# Patient Record
Sex: Female | Born: 1960
Health system: Southern US, Community
[De-identification: ages and names within clinical notes are randomized; demographics above are authoritative.]

## PROBLEM LIST (undated history)

## (undated) DIAGNOSIS — E559 Vitamin D deficiency, unspecified: Secondary | ICD-10-CM

## (undated) DIAGNOSIS — K219 Gastro-esophageal reflux disease without esophagitis: Secondary | ICD-10-CM

## (undated) DIAGNOSIS — N83209 Unspecified ovarian cyst, unspecified side: Secondary | ICD-10-CM

## (undated) DIAGNOSIS — F419 Anxiety disorder, unspecified: Secondary | ICD-10-CM

## (undated) DIAGNOSIS — E785 Hyperlipidemia, unspecified: Secondary | ICD-10-CM

## (undated) DIAGNOSIS — D649 Anemia, unspecified: Secondary | ICD-10-CM

## (undated) DIAGNOSIS — F329 Major depressive disorder, single episode, unspecified: Secondary | ICD-10-CM

## (undated) DIAGNOSIS — F32A Depression, unspecified: Secondary | ICD-10-CM

## (undated) HISTORY — DX: Vitamin D deficiency, unspecified: E55.9

## (undated) HISTORY — PX: LIPOSUCTION: SHX10

## (undated) HISTORY — DX: Hyperlipidemia, unspecified: E78.5

## (undated) HISTORY — DX: Depression, unspecified: F32.A

## (undated) HISTORY — DX: Anxiety disorder, unspecified: F41.9

## (undated) HISTORY — PX: COLONOSCOPY: SHX174

## (undated) HISTORY — DX: Unspecified ovarian cyst, unspecified side: N83.209

## (undated) HISTORY — PX: ESOPHAGOGASTRODUODENOSCOPY: SHX1529

## (undated) HISTORY — DX: Anemia, unspecified: D64.9

## (undated) HISTORY — PX: TOE SURGERY: SHX1073

## (undated) HISTORY — PX: ABDOMINAL HYSTERECTOMY: SHX81

---
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
## 2005-03-03 ENCOUNTER — Other Ambulatory Visit: Admission: RE | Admit: 2005-03-03 | Discharge: 2005-03-03 | Payer: Self-pay | Admitting: Obstetrics & Gynecology

## 2007-12-27 ENCOUNTER — Encounter: Admission: RE | Admit: 2007-12-27 | Discharge: 2007-12-27 | Payer: Self-pay | Admitting: Interventional Radiology

## 2008-01-06 ENCOUNTER — Encounter: Admission: RE | Admit: 2008-01-06 | Discharge: 2008-01-06 | Payer: Self-pay | Admitting: Interventional Radiology

## 2008-02-01 ENCOUNTER — Encounter: Admission: RE | Admit: 2008-02-01 | Discharge: 2008-02-01 | Payer: Self-pay | Admitting: Interventional Radiology

## 2008-02-20 ENCOUNTER — Ambulatory Visit (HOSPITAL_COMMUNITY): Admission: RE | Admit: 2008-02-20 | Discharge: 2008-02-20 | Payer: Self-pay | Admitting: Gynecology

## 2008-02-22 ENCOUNTER — Ambulatory Visit: Payer: Self-pay | Admitting: Oncology

## 2008-03-06 ENCOUNTER — Ambulatory Visit: Payer: Self-pay | Admitting: Gastroenterology

## 2008-04-06 ENCOUNTER — Ambulatory Visit: Payer: Self-pay | Admitting: Gastroenterology

## 2008-04-06 ENCOUNTER — Ambulatory Visit: Payer: Self-pay | Admitting: Oncology

## 2012-11-07 ENCOUNTER — Ambulatory Visit (INDEPENDENT_AMBULATORY_CARE_PROVIDER_SITE_OTHER): Payer: 59 | Admitting: Obstetrics and Gynecology

## 2012-11-07 ENCOUNTER — Encounter: Payer: Self-pay | Admitting: Obstetrics and Gynecology

## 2012-11-07 VITALS — BP 106/64 | HR 66 | Ht 66.5 in | Wt 172.0 lb

## 2012-11-07 DIAGNOSIS — Z0000 Encounter for general adult medical examination without abnormal findings: Secondary | ICD-10-CM

## 2012-11-07 DIAGNOSIS — Z124 Encounter for screening for malignant neoplasm of cervix: Secondary | ICD-10-CM

## 2012-11-07 LAB — CBC
HCT: 35.7 % — ABNORMAL LOW (ref 36.0–46.0)
MCV: 84.4 fL (ref 78.0–100.0)
Platelets: 274 10*3/uL (ref 150–400)
RBC: 4.23 MIL/uL (ref 3.87–5.11)
RDW: 13.2 % (ref 11.5–15.5)
WBC: 4.4 10*3/uL (ref 4.0–10.5)

## 2012-11-07 NOTE — Progress Notes (Signed)
Last Pap: 09/09/07 WNL: Yes Regular Periods:no Contraception: none, pt not currently sexually active  Monthly Breast exam:yes Tetanus<17yrs:no pt unsure Nl.Bladder Function:yes Daily BMs:yes Healthy Diet:yes Calcium:no Mammogram:yes Date of Mammogram: 2-3 years ago per pt Exercise:yes Have often Exercise: 3 times per week  Seatbelt: yes Abuse at home: no Stressful work:no Sigmoid-colonoscopy: 2010 per pt Bone Density: No PCP: none Change in PMH: none Change in [facility] none Pt requests fasting labs today. BP 106/64  Pulse 66  Ht 5' 6.5" (1.689 m)  Wt 172 lb (78.019 kg)  BMI 27.35 kg/m2  LMP 07/14/2012 Pt with complaints:no Physical Examination: General appearance - alert, well appearing, and in no distress Mental status - normal mood, behavior, speech, dress, motor activity, and thought processes Neck - supple, no significant adenopathy,  thyroid exam: thyroid is normal in size without nodules or tenderness Chest - clear to auscultation, no wheezes, rales or rhonchi, symmetric air entry Heart - normal rate and regular rhythm Abdomen - soft, nontender, nondistended, no masses or organomegaly Breasts - breasts appear normal, no suspicious masses, no skin or nipple changes or axillary nodes Pelvic - normal external genitalia, vulva, vagina, cervix, uterus and adnexa Rectal - normal rectal, no masses Back exam - full range of motion, no tenderness, palpable spasm or pain on motion Neurological - alert, oriented, normal speech, no focal findings or movement disorder noted Musculoskeletal - no joint tenderness, deformity or swelling Extremities - no edema, redness or tenderness in the calves or thighs Skin - normal coloration and turgor, no rashes, no suspicious skin lesions noted Routine exam Pap sent yes Mammogram due yes will schedule abstinance used for contraception RT 1 yr

## 2012-11-08 ENCOUNTER — Telehealth: Payer: Self-pay

## 2012-11-08 LAB — COMPREHENSIVE METABOLIC PANEL
ALT: 11 U/L (ref 0–35)
BUN: 11 mg/dL (ref 6–23)
CO2: 30 mEq/L (ref 19–32)
Calcium: 9.7 mg/dL (ref 8.4–10.5)
Chloride: 101 mEq/L (ref 96–112)
Creat: 0.94 mg/dL (ref 0.50–1.10)
Glucose, Bld: 85 mg/dL (ref 70–99)
Total Bilirubin: 0.7 mg/dL (ref 0.3–1.2)

## 2012-11-08 LAB — TSH: TSH: 1.653 u[IU]/mL (ref 0.350–4.500)

## 2012-11-08 LAB — LIPID PANEL
HDL: 54 mg/dL (ref >39)
Total CHOL/HDL Ratio: 3.4 Ratio
Triglycerides: 105 mg/dL (ref <150)

## 2012-11-08 NOTE — Telephone Encounter (Signed)
Spoke with pt rgd labs informed elevated ldl need f/u with pcp pt voice understanding

## 2012-11-08 NOTE — Telephone Encounter (Signed)
Message copied by Rolla Plate on Tue Nov 08, 2012 11:32 AM ------      Message from: Jaymes Graff      Created: Tue Nov 08, 2012 11:31 AM       Please review the labs with the patient.  She needs to follow up with a PCP because her LDL is elevated.  You can refer her to Dr Daphine Deutscher or Dr Nehemiah Settle

## 2012-11-09 LAB — PAP IG, CT-NG, RFX HPV ASCU: GC Probe Amp: NEGATIVE

## 2013-03-24 ENCOUNTER — Encounter: Payer: Self-pay | Admitting: Gastroenterology

## 2013-08-18 ENCOUNTER — Ambulatory Visit: Payer: Self-pay | Admitting: Internal Medicine

## 2013-08-18 VITALS — BP 114/64 | HR 66 | Temp 98.7°F | Resp 18 | Ht 67.0 in | Wt 152.0 lb

## 2013-08-18 DIAGNOSIS — R350 Frequency of micturition: Secondary | ICD-10-CM

## 2013-08-18 DIAGNOSIS — R319 Hematuria, unspecified: Secondary | ICD-10-CM

## 2013-08-18 DIAGNOSIS — N390 Urinary tract infection, site not specified: Secondary | ICD-10-CM

## 2013-08-18 LAB — POCT UA - MICROSCOPIC ONLY
Casts, Ur, LPF, POC: NEGATIVE
Mucus, UA: NEGATIVE

## 2013-08-18 LAB — POCT URINALYSIS DIPSTICK
Bilirubin, UA: NEGATIVE
Glucose, UA: NEGATIVE
Ketones, UA: NEGATIVE
Nitrite, UA: NEGATIVE
pH, UA: 7

## 2013-08-18 MED ORDER — CIPROFLOXACIN HCL 500 MG PO TABS: 500.0000 mg | ORAL_TABLET | Freq: Two times a day (BID) | ORAL | Status: DC

## 2013-08-18 NOTE — Progress Notes (Signed)
  Subjective:    Patient ID: Megan Miles, female    DOB: [birth year], 52 y.o.   MRN: [id number]  HPI    Review of Systems     Objective:   Physical Exam   Results for orders placed in visit on 08/18/13  POCT URINALYSIS DIPSTICK      Result Value Range   Color, UA yellow     Clarity, UA clear     Glucose, UA neg     Bilirubin, UA neg     Ketones, UA neg     Spec Grav, UA 1.020     Blood, UA trace-intact     pH, UA 7.0     Protein, UA neg     Urobilinogen, UA 0.2     Nitrite, UA neg     Leukocytes, UA moderate (2+)    POCT UA - MICROSCOPIC ONLY      Result Value Range   WBC, Ur, HPF, POC 6-8     RBC, urine, microscopic 0-3     Bacteria, U Microscopic 1+     Mucus, UA neg     Epithelial cells, urine per micros 3-5     Crystals, Ur, HPF, POC neg     Casts, Ur, LPF, POC neg     Yeast, UA neg     Amorphous, UA pos          Assessment & Plan:  UTI/Cipro 10d

## 2013-08-18 NOTE — Patient Instructions (Signed)
Urinary Tract Infection  Urinary tract infections (UTIs) can develop anywhere along your urinary tract. Your urinary tract is your body's drainage system for removing wastes and extra water. Your urinary tract includes two kidneys, two ureters, a bladder, and a urethra. Your kidneys are a pair of bean-shaped organs. Each kidney is about the size of your fist. They are located below your ribs, one on each side of your spine.  CAUSES  Infections are caused by microbes, which are microscopic organisms, including fungi, viruses, and bacteria. These organisms are so small that they can only be seen through a microscope. Bacteria are the microbes that most commonly cause UTIs.  SYMPTOMS   Symptoms of UTIs may vary by age and gender of the patient and by the location of the infection. Symptoms in young women typically include a frequent and intense urge to urinate and a painful, burning feeling in the bladder or urethra during urination. Older women and men are more likely to be tired, shaky, and weak and have muscle aches and abdominal pain. A fever may mean the infection is in your kidneys. Other symptoms of a kidney infection include pain in your back or sides below the ribs, nausea, and vomiting.  DIAGNOSIS  To diagnose a UTI, your caregiver will ask you about your symptoms. Your caregiver also will ask to provide a urine sample. The urine sample will be tested for bacteria and white blood cells. White blood cells are made by your body to help fight infection.  TREATMENT   Typically, UTIs can be treated with medication. Because most UTIs are caused by a bacterial infection, they usually can be treated with the use of antibiotics. The choice of antibiotic and length of treatment depend on your symptoms and the type of bacteria causing your infection.  HOME CARE INSTRUCTIONS   If you were prescribed antibiotics, take them exactly as your caregiver instructs you. Finish the medication even if you feel better after you  have only taken some of the medication.   Drink enough water and fluids to keep your urine clear or pale yellow.   Avoid caffeine, tea, and carbonated beverages. They tend to irritate your bladder.   Empty your bladder often. Avoid holding urine for long periods of time.   Empty your bladder before and after sexual intercourse.   After a bowel movement, women should cleanse from front to back. Use each tissue only once.  SEEK MEDICAL CARE IF:    You have back pain.   You develop a fever.   Your symptoms do not begin to resolve within 3 days.  SEEK IMMEDIATE MEDICAL CARE IF:    You have severe back pain or lower abdominal pain.   You develop chills.   You have nausea or vomiting.   You have continued burning or discomfort with urination.  MAKE SURE YOU:    Understand these instructions.   Will watch your condition.   Will get help right away if you are not doing well or get worse.  Document Released: 09/09/2005 Document Revised: 05/31/2012 Document Reviewed: 01/08/2012  ExitCare Patient Information 2014 [employer].

## 2013-08-18 NOTE — Progress Notes (Signed)
  Subjective:    Patient ID: Megan Miles, female    DOB: [birth year], 52 y.o.   MRN: [id number]  HPI 52 year old African American female is here today with complaints of frequent urination and leg tenderness for the past four weeks. Patient does not have a PCP. Patient sees a gyn - Dr. Jennette Kettle at [facility]. Pt states she has had a UTI before. Pt states she has no other complaints. No fever,chills, flank pain.    Review of Systems neg    Objective:   Physical Exam  Vitals reviewed. Constitutional: She is oriented to person, place, and time. She appears well-developed and well-nourished. No distress.  HENT:  Head: Normocephalic.  Pulmonary/Chest: Effort normal.  Abdominal: Soft.  Musculoskeletal: Normal range of motion.  Neurological: She is alert and oriented to person, place, and time. She exhibits normal muscle tone. Coordination normal.  Psychiatric: She has a normal mood and affect.    Tender over bladder Results for orders placed in visit on 08/18/13  POCT URINALYSIS DIPSTICK      Result Value Range   Color, UA yellow     Clarity, UA clear     Glucose, UA neg     Bilirubin, UA neg     Ketones, UA neg     Spec Grav, UA 1.020     Blood, UA trace-intact     pH, UA 7.0     Protein, UA neg     Urobilinogen, UA 0.2     Nitrite, UA neg     Leukocytes, UA moderate (2+)    POCT UA - MICROSCOPIC ONLY      Result Value Range   WBC, Ur, HPF, POC 6-8     RBC, urine, microscopic 0-3     Bacteria, U Microscopic 1+     Mucus, UA neg     Epithelial cells, urine per micros 3-5     Crystals, Ur, HPF, POC neg     Casts, Ur, LPF, POC neg     Yeast, UA neg     Amorphous, UA pos          Assessment & Plan:  UTI/Cipro 10d

## 2013-08-20 LAB — URINE CULTURE: Colony Count: 50000

## 2013-09-07 ENCOUNTER — Telehealth: Payer: Self-pay

## 2013-09-07 NOTE — Telephone Encounter (Signed)
Patient of Dr. Perrin Maltese,  She was at the gym this morning and pulled a muscle.  Currently, she is in [place] to catch a plane in the morning to [place].    Patient is requesting a doctor to call her at [phone].

## 2013-09-07 NOTE — Telephone Encounter (Signed)
Called her, she can try Aleve bid and she should apply heat/ perhaps a thermacare wrap. She will try these.

## 2013-12-28 ENCOUNTER — Emergency Department (HOSPITAL_COMMUNITY)
Admission: EM | Admit: 2013-12-28 | Discharge: 2013-12-28 | Disposition: A | Discharge status: Home / Self Care | Payer: No Typology Code available for payment source | Attending: Emergency Medicine | Admitting: Emergency Medicine

## 2013-12-28 ENCOUNTER — Emergency Department (HOSPITAL_COMMUNITY): Payer: No Typology Code available for payment source

## 2013-12-28 ENCOUNTER — Encounter (HOSPITAL_COMMUNITY): Payer: Self-pay | Admitting: Emergency Medicine

## 2013-12-28 DIAGNOSIS — S0990XA Unspecified injury of head, initial encounter: Secondary | ICD-10-CM | POA: Insufficient documentation

## 2013-12-28 DIAGNOSIS — S298XXA Other specified injuries of thorax, initial encounter: Secondary | ICD-10-CM | POA: Insufficient documentation

## 2013-12-28 DIAGNOSIS — Y92410 Unspecified street and highway as the place of occurrence of the external cause: Secondary | ICD-10-CM | POA: Insufficient documentation

## 2013-12-28 DIAGNOSIS — Z862 Personal history of diseases of the blood and blood-forming organs and certain disorders involving the immune mechanism: Secondary | ICD-10-CM | POA: Insufficient documentation

## 2013-12-28 DIAGNOSIS — S6990XA Unspecified injury of unspecified wrist, hand and finger(s), initial encounter: Secondary | ICD-10-CM | POA: Insufficient documentation

## 2013-12-28 DIAGNOSIS — IMO0002 Reserved for concepts with insufficient information to code with codable children: Secondary | ICD-10-CM | POA: Insufficient documentation

## 2013-12-28 DIAGNOSIS — S59909A Unspecified injury of unspecified elbow, initial encounter: Secondary | ICD-10-CM | POA: Insufficient documentation

## 2013-12-28 DIAGNOSIS — Z8742 Personal history of other diseases of the female genital tract: Secondary | ICD-10-CM | POA: Insufficient documentation

## 2013-12-28 DIAGNOSIS — Z792 Long term (current) use of antibiotics: Secondary | ICD-10-CM | POA: Insufficient documentation

## 2013-12-28 DIAGNOSIS — Y9389 Activity, other specified: Secondary | ICD-10-CM | POA: Insufficient documentation

## 2013-12-28 DIAGNOSIS — S59919A Unspecified injury of unspecified forearm, initial encounter: Secondary | ICD-10-CM

## 2013-12-28 MED ORDER — ONDANSETRON HCL 4 MG/2ML IJ SOLN
4.0000 mg | Freq: Once | INTRAMUSCULAR | Status: AC
  Administered 2013-12-28: 4 mg via INTRAVENOUS
  Filled 2013-12-28: qty 2

## 2013-12-28 MED ORDER — MORPHINE SULFATE 4 MG/ML IJ SOLN
4.0000 mg | Freq: Once | INTRAMUSCULAR | Status: AC
  Administered 2013-12-28: 4 mg via INTRAVENOUS
  Filled 2013-12-28: qty 1

## 2013-12-28 MED ORDER — OXYCODONE-ACETAMINOPHEN 5-325 MG PO TABS: 2.0000 | ORAL_TABLET | Freq: Four times a day (QID) | ORAL | Status: DC | PRN

## 2013-12-28 MED ORDER — METHOCARBAMOL 500 MG PO TABS: 500.0000 mg | ORAL_TABLET | Freq: Two times a day (BID) | ORAL | Status: DC

## 2013-12-28 MED ORDER — KETOROLAC TROMETHAMINE 30 MG/ML IJ SOLN
30.0000 mg | Freq: Once | INTRAMUSCULAR | Status: AC
  Administered 2013-12-28: 30 mg via INTRAVENOUS
  Filled 2013-12-28: qty 1

## 2013-12-28 MED ORDER — IBUPROFEN 800 MG PO TABS: 800.0000 mg | ORAL_TABLET | Freq: Three times a day (TID) | ORAL | Status: DC

## 2013-12-28 MED ORDER — HYDROMORPHONE HCL PF 1 MG/ML IJ SOLN
1.0000 mg | Freq: Once | INTRAMUSCULAR | Status: AC
  Administered 2013-12-28: 1 mg via INTRAVENOUS
  Filled 2013-12-28: qty 1
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
## 2013-12-28 NOTE — ED Notes (Signed)
Patient transported to X-ray 
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
## 2013-12-28 NOTE — ED Notes (Signed)
Pt states currently unable to ambulate, sts that she just received a dose of pain med and she feels a little loopy.  Sts she will wait a few minutes and ring when ready.
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
## 2013-12-29 NOTE — ED Notes (Signed)
Pt ambulating independently w/ steady gait on d/c in no acute distress, A&Ox4. D/c instructions reviewed w/ pt and family - pt and family deny any further questions or concerns at present. Rx given x3  

## 2014-01-01 NOTE — ED Provider Notes (Signed)
Medical screening examination/treatment/procedure(s) were conducted as a shared visit with non-physician practitioner(s) and myself.  I personally evaluated the patient during the encounter.  EKG Interpretation   None        Leota Jacobsen, MD 01/01/14 [phone]

## 2014-01-02 ENCOUNTER — Telehealth (HOSPITAL_COMMUNITY): Payer: Self-pay

## 2014-01-02 NOTE — ED Notes (Signed)
Pt calling for hand/ortho on call MD for when she was here.  Was told she would be referred to hand MD but didn't receive info.  Informed Dr Caralyn Guile on call for duration of her visit.  Pt provided contact info.

## 2014-05-13 ENCOUNTER — Encounter: Payer: Self-pay | Admitting: Gastroenterology

## 2014-10-15 ENCOUNTER — Encounter (HOSPITAL_COMMUNITY): Payer: Self-pay | Admitting: Emergency Medicine

## 2015-02-26 ENCOUNTER — Ambulatory Visit (INDEPENDENT_AMBULATORY_CARE_PROVIDER_SITE_OTHER): Payer: BLUE CROSS/BLUE SHIELD | Admitting: Physician Assistant

## 2015-02-26 VITALS — BP 125/80 | HR 71 | Temp 97.4°F | Resp 20 | Ht 67.0 in | Wt 163.2 lb

## 2015-02-26 DIAGNOSIS — R109 Unspecified abdominal pain: Secondary | ICD-10-CM | POA: Diagnosis not present

## 2015-02-26 DIAGNOSIS — R112 Nausea with vomiting, unspecified: Secondary | ICD-10-CM | POA: Diagnosis not present

## 2015-02-26 MED ORDER — ONDANSETRON 8 MG PO TBDP: 8.0000 mg | ORAL_TABLET | Freq: Three times a day (TID) | ORAL | Status: DC | PRN

## 2015-02-26 MED ORDER — HYOSCYAMINE SULFATE 0.125 MG SL SUBL: 0.1250 mg | SUBLINGUAL_TABLET | SUBLINGUAL | Status: DC | PRN

## 2015-02-26 NOTE — Patient Instructions (Signed)
Please continue to drink water and stay hydrated.  If you are not able to drink fluids and keep them down, you need to return to the clinic. Please return to the clinic if your symptoms do not improve in 24 hrs. You may have some diarrhea within the next 24 hours.  Please allow this to occur without anti-diarrhea medication.

## 2015-02-26 NOTE — Progress Notes (Signed)
[facility] [address], [place] [address] [phone]  Date:  02/26/2015   Name:  Megan Miles   DOB:  05-11-1961   MRN:  [id number]  PCP:  Pcp Not In System    Chief Complaint: Emesis   History of Present Illness:  Megan Miles is a 54 y.o. very pleasant female patient who presents with the following:  Patient comes in with concern of emesis.  She states that last night she had abdominal pain after eating bbq pork last night.  By this evening she had one episode of non-bloody, or bilious emesis.  Patient states that her abdominal pain is there but less so.  She has no fever, headaches, or dizziness.  The nausea is resolving.  She denies dysuria, polyuria, vaginal bleeding, abnormal discharge, or odor.  She has no fever.  She states that no one else had eaten the bbq pork with cheese at a late hour at home.  No one else had eaten the dish, according to patient, because it looked odd.    There are no active problems to display for this patient.   Past Medical History  Diagnosis Date  . Ovarian cyst   . Anemia     low iron    Past Surgical History  Procedure Laterality Date  . Liposuction  '90s  . Toe surgery  '90s    History  Substance Use Topics  . Smoking status: Never Smoker   . Smokeless tobacco: Never Used  . Alcohol Use: No    Family History  Problem Relation Age of Onset  . Diabetes Mother   . Cancer Brother     colon   . Hypertension Sister     No Known Allergies  Medication list has been reviewed and updated.  Current Outpatient Prescriptions on File Prior to Visit  Medication Sig Dispense Refill  . Multiple Vitamins-Minerals (MULTIVITAMIN WITH MINERALS) tablet Take 1 tablet by mouth daily.     No current facility-administered medications on file prior to visit.    Review of Systems: ROS otherwise unremarkable unless listed above.   Physical Examination: Filed Vitals:   02/26/15 1918  BP: 125/80  Pulse: 71  Temp: 97.4  F (36.3 C)  Resp: 20   Filed Vitals:   02/26/15 1918  Height: 5\' 7"  (1.702 m)  Weight: 163 lb 4 oz (74.05 kg)   Body mass index is 25.56 kg/(m^2). Ideal Body Weight: Weight in (lb) to have BMI = 25: 159.3  Physical Exam  Constitutional: She is oriented to person, place, and time. She appears well-developed and well-nourished.  HENT:  Head: Normocephalic and atraumatic.  Eyes: EOM are normal. Pupils are equal, round, and reactive to light. Right eye exhibits no discharge. Left eye exhibits no discharge.  Cardiovascular: Normal rate and regular rhythm.  Exam reveals no friction rub.   No murmur heard. Pulmonary/Chest: Effort normal and breath sounds normal. No respiratory distress. She has no wheezes.  Abdominal: Soft. Bowel sounds are normal. There is no splenomegaly or hepatomegaly. There is tenderness (Mild tenderness in the epigastric area.). There is no tenderness at McBurney's point and negative Murphy's sign.  Neurological: She is alert and oriented to person, place, and time.  Skin: Skin is warm and dry.  Psychiatric: She has a normal mood and affect. Her behavior is normal.     Assessment and Plan: 54 year old female is here today for chief complaint of nausea, vomiting, and abdominal cramping.  Possible  food contamination, gerd, gastritis.    Nausea and vomiting, vomiting of unspecified type - Plan: ondansetron (ZOFRAN-ODT) 8 MG disintegrating tablet, DISCONTINUED: ondansetron (ZOFRAN-ODT) 8 MG disintegrating tablet  Abdominal cramping - Plan: hyoscyamine (LEVSIN/SL) 0.125 MG SL tablet, DISCONTINUED: hyoscyamine (LEVSIN/SL) 0.125 MG SL tablet  Ivar Drape, PA-C [facility] 3/17/20169:00 AM  Attempted to call patient to discuss how she was feeling, and to also advise from eating late, and that symptoms could be linked to gerd, depending on following symptoms, but operator states that phone may be changed or  disconnected.

## 2015-03-02 ENCOUNTER — Telehealth: Payer: Self-pay

## 2015-03-02 NOTE — Telephone Encounter (Signed)
Pt was seen recently for food poisoning and put on brat diet and has still not had a bowel movement  And stomach is still swollen   Best number [phone]

## 2015-03-04 NOTE — Telephone Encounter (Signed)
lmom to cb. 

## 2015-03-04 NOTE — Telephone Encounter (Signed)
Pt missed her CB and would like another. Please advise at [phone]

## 2015-03-05 NOTE — Telephone Encounter (Signed)
Left a detailed message on her machine advising her to RTC for evaluation if she is still having symptoms.

## 2016-05-19 ENCOUNTER — Emergency Department (HOSPITAL_COMMUNITY)
Admission: EM | Admit: 2016-05-19 | Discharge: 2016-05-19 | Disposition: A | Discharge status: Home / Self Care | Payer: 59 | Attending: Emergency Medicine | Admitting: Emergency Medicine

## 2016-05-19 ENCOUNTER — Emergency Department (HOSPITAL_COMMUNITY): Payer: 59

## 2016-05-19 ENCOUNTER — Encounter (HOSPITAL_COMMUNITY): Payer: Self-pay

## 2016-05-19 DIAGNOSIS — R0602 Shortness of breath: Secondary | ICD-10-CM | POA: Diagnosis not present

## 2016-05-19 DIAGNOSIS — R05 Cough: Secondary | ICD-10-CM | POA: Diagnosis not present

## 2016-05-19 DIAGNOSIS — Z79899 Other long term (current) drug therapy: Secondary | ICD-10-CM | POA: Insufficient documentation

## 2016-05-19 DIAGNOSIS — R079 Chest pain, unspecified: Secondary | ICD-10-CM | POA: Insufficient documentation

## 2016-05-19 DIAGNOSIS — R0789 Other chest pain: Secondary | ICD-10-CM | POA: Diagnosis not present

## 2016-05-19 LAB — BASIC METABOLIC PANEL
ANION GAP: 7 (ref 5–15)
BUN: 8 mg/dL (ref 6–20)
CHLORIDE: 107 mmol/L (ref 101–111)
CO2: 25 mmol/L (ref 22–32)
Calcium: 9.6 mg/dL (ref 8.9–10.3)
Creatinine, Ser: 0.89 mg/dL (ref 0.44–1.00)
GFR calc Af Amer: >60 mL/min (ref >60)
GLUCOSE: 85 mg/dL (ref 65–99)
POTASSIUM: 3.4 mmol/L — AB (ref 3.5–5.1)
Sodium: 139 mmol/L (ref 135–145)

## 2016-05-19 LAB — CBC
HEMATOCRIT: 36.9 % (ref 36.0–46.0)
HEMOGLOBIN: 11.5 g/dL — AB (ref 12.0–15.0)
MCH: 27.1 pg (ref 26.0–34.0)
MCHC: 31.2 g/dL (ref 30.0–36.0)
MCV: 87 fL (ref 78.0–100.0)
Platelets: 274 10*3/uL (ref 150–400)
RBC: 4.24 MIL/uL (ref 3.87–5.11)
RDW: 13.2 % (ref 11.5–15.5)
WBC: 4 10*3/uL (ref 4.0–10.5)

## 2016-05-19 LAB — D-DIMER, QUANTITATIVE (NOT AT ARMC): D DIMER QUANT: 0.27 ug{FEU}/mL (ref 0.00–0.50)

## 2016-05-19 LAB — I-STAT TROPONIN, ED
Troponin i, poc: 0 ng/mL (ref 0.00–0.08)
Troponin i, poc: 0.01 ng/mL (ref 0.00–0.08)

## 2016-05-19 MED ORDER — TRAMADOL HCL 50 MG PO TABS: 50.0000 mg | ORAL_TABLET | Freq: Four times a day (QID) | ORAL | Status: DC | PRN

## 2016-05-19 MED ORDER — KETOROLAC TROMETHAMINE 30 MG/ML IJ SOLN
60.0000 mg | Freq: Once | INTRAMUSCULAR | Status: AC
  Administered 2016-05-19: 60 mg via INTRAMUSCULAR
  Filled 2016-05-19: qty 2

## 2016-05-19 MED ORDER — KETOROLAC TROMETHAMINE 30 MG/ML IJ SOLN: 30.0000 mg | Freq: Once | INTRAMUSCULAR | Status: DC

## 2016-05-19 NOTE — ED Provider Notes (Signed)
CSN: [id number]     Arrival date & time 05/19/16  1549 History   First MD Initiated Contact with Patient 05/19/16 1942     Chief Complaint  Patient presents with  . Chest Pain     (Consider location/radiation/quality/duration/timing/severity/associated sxs/prior Treatment) HPI Comments: Patient presents with chest pain. She describes a left-sided chest pain that started about 2:30 this afternoon. Spin constant since then. It's worse when she takes a deep breath and when she moves her arm around. She's had similar type pain to her left chest since she was a teenager intermittently. She denies any shortness of breath. No nausea or vomiting. No leg pain or swelling. No radiation of the pain. She took aspirin earlier today and it felt better. It's nonexertional. She reports no recent exacerbating activities. No recent long trips. No family history of early heart disease. She's a nonsmoker. There is no history of diabetes, hypertension or hyperlipidemia.  Patient is a 55 y.o. female presenting with chest pain.  Chest Pain Associated symptoms: no abdominal pain, no back pain, no cough, no diaphoresis, no dizziness, no fatigue, no fever, no headache, no nausea, no numbness, no shortness of breath, not vomiting and no weakness     History reviewed. No pertinent past medical history. History reviewed. No pertinent past surgical history. No family history on file. Social History  Substance Use Topics  . Smoking status: Never Smoker   . Smokeless tobacco: None  . Alcohol Use: None   OB History    No data available     Review of Systems  Constitutional: Negative for fever, chills, diaphoresis and fatigue.  HENT: Negative for congestion, rhinorrhea and sneezing.   Eyes: Negative.   Respiratory: Negative for cough, chest tightness and shortness of breath.   Cardiovascular: Positive for chest pain. Negative for leg swelling.  Gastrointestinal: Negative for nausea, vomiting, abdominal pain,  diarrhea and blood in stool.  Genitourinary: Negative for frequency, hematuria, flank pain and difficulty urinating.  Musculoskeletal: Negative for back pain and arthralgias.  Skin: Negative for rash.  Neurological: Negative for dizziness, speech difficulty, weakness, numbness and headaches.      Allergies  Shellfish allergy  Home Medications   Prior to Admission medications   Medication Sig Start Date End Date Taking? Authorizing Provider  Omega-3 Fatty Acids (FISH OIL) 1000 MG CAPS Take 2,000 mg by mouth 2 (two) times daily.   Yes Historical Provider, MD  OVER THE COUNTER MEDICATION Laminine: Take 1 capsule by mouth daily   Yes Historical Provider, MD  traMADol (ULTRAM) 50 MG tablet Take 1 tablet (50 mg total) by mouth every 6 (six) hours as needed. 05/19/16   Malvin Johns, MD   BP 130/86 mmHg  Pulse 66  Temp(Src) 97.8 F (36.6 C)  Resp 19  SpO2 100% Physical Exam  Constitutional: She is oriented to person, place, and time. She appears well-developed and well-nourished.  HENT:  Head: Normocephalic and atraumatic.  Eyes: Pupils are equal, round, and reactive to light.  Neck: Normal range of motion. Neck supple.  Cardiovascular: Normal rate, regular rhythm and normal heart sounds.   Pulmonary/Chest: Effort normal and breath sounds normal. No respiratory distress. She has no wheezes. She has no rales. She exhibits tenderness (Reproducible tenderness to left chest wall).  Abdominal: Soft. Bowel sounds are normal. There is no tenderness. There is no rebound and no guarding.  Musculoskeletal: Normal range of motion. She exhibits no edema.  Lymphadenopathy:    She has no cervical adenopathy.  Neurological:  She is alert and oriented to person, place, and time.  Skin: Skin is warm and dry. No rash noted.  Psychiatric: She has a normal mood and affect.    ED Course  Procedures (including critical care time) Labs Review Labs Reviewed  BASIC METABOLIC PANEL - Abnormal; Notable for  the following:    Potassium 3.4 (*)    All other components within normal limits  CBC - Abnormal; Notable for the following:    Hemoglobin 11.5 (*)    All other components within normal limits  D-DIMER, QUANTITATIVE (NOT AT [facility])  I-STAT TROPOININ, ED  Randolm Idol, ED    Imaging Review Dg Chest 2 View  05/19/2016  CLINICAL DATA:  LEFT chest pain and shortness of breath beginning today, slight cough, finished a Z-Pak last week for a sinus infection EXAM: CHEST  2 VIEW COMPARISON:  None FINDINGS: Normal heart size, mediastinal contours, and pulmonary vascularity. Lungs clear. No pleural effusion or pneumothorax. Bones unremarkable. IMPRESSION: Normal exam. Electronically Signed   By: Lavonia Dana M.D.   On: 05/19/2016 16:22   I have personally reviewed and evaluated these images and lab results as part of my medical decision-making.   EKG Interpretation   Date/Time:  Tuesday May 19 2016 15:54:43 EDT Ventricular Rate:  73 PR Interval:  184 QRS Duration: 62 QT Interval:  382 QTC Calculation: 420 R Axis:   72 Text Interpretation:  Normal sinus rhythm with sinus arrhythmia Low  voltage QRS Nonspecific T wave abnormality Abnormal ECG No old tracing to  compare Reconfirmed by Torian Thoennes  MD, Daksha Koone ([id number]) on 05/19/2016 6:51:14 PM      MDM   Final diagnoses:  Chest pain, unspecified chest pain type    Patient presents with chest pain. Her chest pain is worse with inspiration and worse with palpation of the left chest wall. She has no hypoxia. No EKG changes. She's had 2 troponins that it been negative. She has no other risk factors for coronary artery disease. Her heart score is 2.  I Silva Bandy is likely musculoskeletal in nature. She has no suggestions of a pulmonary embolus. Her chest x-ray is normal without pneumonia or pneumothorax. Her symptoms have improved after dose of Toradol. She was discharged home in good condition. She was encouraged to establish care with a primary care  provider. She was encouraged to return here if she has any ongoing or worsening symptoms.    Malvin Johns, MD 05/19/16 2117

## 2016-05-19 NOTE — Discharge Instructions (Signed)
Nonspecific Chest Pain  °Chest pain can be caused by many different conditions. There is always a chance that your pain could be related to something serious, such as a heart attack or a blood clot in your lungs. Chest pain can also be caused by conditions that are not life-threatening. If you have chest pain, it is very important to follow up with your health care provider. °CAUSES  °Chest pain can be caused by: °· Heartburn. °· Pneumonia or bronchitis. °· Anxiety or stress. °· Inflammation around your heart (pericarditis) or lung (pleuritis or pleurisy). °· A blood clot in your lung. °· A collapsed lung (pneumothorax). It can develop suddenly on its own (spontaneous pneumothorax) or from trauma to the chest. °· Shingles infection (varicella-zoster virus). °· Heart attack. °· Damage to the bones, muscles, and cartilage that make up your chest wall. This can include: °¨ Bruised bones due to injury. °¨ Strained muscles or cartilage due to frequent or repeated coughing or overwork. °¨ Fracture to one or more ribs. °¨ Sore cartilage due to inflammation (costochondritis). °RISK FACTORS  °Risk factors for chest pain may include: °· Activities that increase your risk for trauma or injury to your chest. °· Respiratory infections or conditions that cause frequent coughing. °· Medical conditions or overeating that can cause heartburn. °· Heart disease or family history of heart disease. °· Conditions or health behaviors that increase your risk of developing a blood clot. °· Having had chicken pox (varicella zoster). °SIGNS AND SYMPTOMS °Chest pain can feel like: °· Burning or tingling on the surface of your chest or deep in your chest. °· Crushing, pressure, aching, or squeezing pain. °· Dull or sharp pain that is worse when you move, cough, or take a deep breath. °· Pain that is also felt in your back, neck, shoulder, or arm, or pain that spreads to any of these areas. °Your chest pain may come and go, or it may stay  constant. °DIAGNOSIS °Lab tests or other studies may be needed to find the cause of your pain. Your health care provider may have you take a test called an ambulatory ECG (electrocardiogram). An ECG records your heartbeat patterns at the time the test is performed. You may also have other tests, such as: °· Transthoracic echocardiogram (TTE). During echocardiography, sound waves are used to create a picture of all of the heart structures and to look at how blood flows through your heart. °· Transesophageal echocardiogram (TEE). This is a more advanced imaging test that obtains images from inside your body. It allows your health care provider to see your heart in finer detail. °· Cardiac monitoring. This allows your health care provider to monitor your heart rate and rhythm in real time. °· Holter monitor. This is a portable device that records your heartbeat and can help to diagnose abnormal heartbeats. It allows your health care provider to track your heart activity for several days, if needed. °· Stress tests. These can be done through exercise or by taking medicine that makes your heart beat more quickly. °· Blood tests. °· Imaging tests. °TREATMENT  °Your treatment depends on what is causing your chest pain. Treatment may include: °· Medicines. These may include: °¨ Acid blockers for heartburn. °¨ Anti-inflammatory medicine. °¨ Pain medicine for inflammatory conditions. °¨ Antibiotic medicine, if an infection is present. °¨ Medicines to dissolve blood clots. °¨ Medicines to treat coronary artery disease. °· Supportive care for conditions that do not require medicines. This may include: °¨ Resting. °¨ Applying heat   or cold packs to injured areas. °¨ Limiting activities until pain decreases. °HOME CARE INSTRUCTIONS °· If you were prescribed an antibiotic medicine, finish it all even if you start to feel better. °· Avoid any activities that bring on chest pain. °· Do not use any tobacco products, including  cigarettes, chewing tobacco, or electronic cigarettes. If you need help quitting, ask your health care provider. °· Do not drink alcohol. °· Take medicines only as directed by your health care provider. °· Keep all follow-up visits as directed by your health care provider. This is important. This includes any further testing if your chest pain does not go away. °· If heartburn is the cause for your chest pain, you may be told to keep your head raised (elevated) while sleeping. This reduces the chance that acid will go from your stomach into your esophagus. °· Make lifestyle changes as directed by your health care provider. These may include: °¨ Getting regular exercise. Ask your health care provider to suggest some activities that are safe for you. °¨ Eating a heart-healthy diet. A registered dietitian can help you to learn healthy eating options. °¨ Maintaining a healthy weight. °¨ Managing diabetes, if necessary. °¨ Reducing stress. °SEEK MEDICAL CARE IF: °· Your chest pain does not go away after treatment. °· You have a rash with blisters on your chest. °· You have a fever. °SEEK IMMEDIATE MEDICAL CARE IF:  °· Your chest pain is worse. °· You have an increasing cough, or you cough up blood. °· You have severe abdominal pain. °· You have severe weakness. °· You faint. °· You have chills. °· You have sudden, unexplained chest discomfort. °· You have sudden, unexplained discomfort in your arms, back, neck, or jaw. °· You have shortness of breath at any time. °· You suddenly start to sweat, or your skin gets clammy. °· You feel nauseous or you vomit. °· You suddenly feel light-headed or dizzy. °· Your heart begins to beat quickly, or it feels like it is skipping beats. °These symptoms may represent a serious problem that is an emergency. Do not wait to see if the symptoms will go away. Get medical help right away. Call your local emergency services (911 in the [place] Do not drive yourself to the hospital. °  °This  information is not intended to replace advice given to you by your health care provider. Make sure you discuss any questions you have with your health care provider. °  °Document Released: 09/09/2005 Document Revised: 12/21/2014 Document Reviewed: 07/06/2014 °Elsevier Interactive Patient Education ©2016 [employer] ° °

## 2016-05-19 NOTE — ED Notes (Signed)
Pt. Came up to the desk and asked if she could have more ASA.  Asked pt. Is she was having chest pain, she stated, "NO , but I can feel the ASA wearing off."   Explained to her that we are not able to give out ASA , after she has taken it already.  We are doing her vitals again.  Explained to pt. That we will get her back as soon as we can.

## 2016-05-19 NOTE — ED Notes (Signed)
Pt departed in NAD.  

## 2016-05-19 NOTE — ED Notes (Signed)
Patient complains of left sided chest pain x 1hour. Pain relieved when she lifts her left arm.  Describes as a spasm. Taking z-pack for sinus infecrtion

## 2016-05-21 MED FILL — traMADol HCL 50 MG TABS: 50 | 4 days supply | Qty: 15 | Fill #0

## 2016-09-02 ENCOUNTER — Encounter (HOSPITAL_COMMUNITY): Payer: Self-pay

## 2017-03-23 DIAGNOSIS — H01009 Unspecified blepharitis unspecified eye, unspecified eyelid: Secondary | ICD-10-CM | POA: Diagnosis not present

## 2017-03-23 DIAGNOSIS — H524 Presbyopia: Secondary | ICD-10-CM | POA: Diagnosis not present

## 2017-03-23 DIAGNOSIS — H40012 Open angle with borderline findings, low risk, left eye: Secondary | ICD-10-CM | POA: Diagnosis not present

## 2017-03-23 DIAGNOSIS — H5213 Myopia, bilateral: Secondary | ICD-10-CM | POA: Diagnosis not present

## 2017-06-01 DIAGNOSIS — Z1231 Encounter for screening mammogram for malignant neoplasm of breast: Secondary | ICD-10-CM | POA: Diagnosis not present

## 2017-06-01 DIAGNOSIS — Z1329 Encounter for screening for other suspected endocrine disorder: Secondary | ICD-10-CM | POA: Diagnosis not present

## 2017-06-01 DIAGNOSIS — Z131 Encounter for screening for diabetes mellitus: Secondary | ICD-10-CM | POA: Diagnosis not present

## 2017-06-01 DIAGNOSIS — Z13220 Encounter for screening for lipoid disorders: Secondary | ICD-10-CM | POA: Diagnosis not present

## 2017-06-01 DIAGNOSIS — Z1151 Encounter for screening for human papillomavirus (HPV): Secondary | ICD-10-CM | POA: Diagnosis not present

## 2017-06-01 DIAGNOSIS — Z01419 Encounter for gynecological examination (general) (routine) without abnormal findings: Secondary | ICD-10-CM | POA: Diagnosis not present

## 2017-06-01 DIAGNOSIS — Z0000 Encounter for general adult medical examination without abnormal findings: Secondary | ICD-10-CM | POA: Diagnosis not present

## 2017-06-01 DIAGNOSIS — Z6826 Body mass index (BMI) 26.0-26.9, adult: Secondary | ICD-10-CM | POA: Diagnosis not present

## 2017-06-01 DIAGNOSIS — Z130 Encounter for screening for diseases of the blood and blood-forming organs and certain disorders involving the immune mechanism: Secondary | ICD-10-CM | POA: Diagnosis not present

## 2017-06-17 ENCOUNTER — Telehealth: Payer: Self-pay | Admitting: Cardiovascular Disease

## 2017-06-17 NOTE — Telephone Encounter (Signed)
Received records from [facility] for appointment on 06/21/17 with Dr Oval Linsey.  Records put with Dr Blenda Mounts schedule for 06/21/17. lp

## 2017-06-20 NOTE — Progress Notes (Signed)
Cardiology Office Note   Date:  06/21/2017   ID:  Megan Miles, DOB 28-Jun-1961, MRN [id number]  PCP:  Servando Salina, MD  Cardiologist:   Skeet Latch, MD   Chief Complaint  Patient presents with  . New Evaluation    high lipids     History of Present Illness: Megan Miles is a 56 y.o. female with hyperlipidemia who is being seen today for the evaluation of hyperlipidemia at the request of Servando Salina, MD.  [name] saw Dr. Garwin Brothers for a routine visit and was noted to have elevated lipids.  She has been feeling well.  She denies chest pain or shortness of breath. She also has not noted any lower extremity edema, orthopnea, or PND. She eats a healthy diet and drinks lots of smooth these. She also uses fresh vegetables and fruits. She walks several days per week and exercises in the gym 3 days per week. She has no exertional symptoms. Her only complaint is occasional muscle spasms in her legs. She does not have any family history of premature coronary artery disease or stroke. She does no smoke and rarely uses alcohol. She notes that the morning she had her lipids checked with Dr. Garwin Brothers she had eaten a bag check and a honey bun 1 hour prior to the lab draw.    Past Medical History:  Diagnosis Date  . Anemia    low iron  . Hyperlipidemia 06/21/2017  . Ovarian cyst   . Vitamin D deficiency 06/21/2017    Past Surgical History:  Procedure Laterality Date  . LIPOSUCTION  '90s  . TOE SURGERY  '90s     Current Outpatient Prescriptions  Medication Sig Dispense Refill  . Multiple Vitamins-Minerals (MULTIVITAMIN WITH MINERALS) tablet Take 1 tablet by mouth daily.    Marland Kitchen OVER THE COUNTER MEDICATION Laminine: Take 1 capsule by mouth daily    . Vitamin D, Ergocalciferol, (DRISDOL) 50000 units CAPS capsule Take 1 capsule by mouth daily.     No current facility-administered medications for this visit.     Allergies:   Shellfish allergy    Social History:  The patient   reports that she has never smoked. She does not have any smokeless tobacco history on file. She reports that she does not drink alcohol or use drugs.   Family History:  The patient's family history includes Cancer in her brother; Colon cancer in her brother; Diabetes in her mother; Hypertension in her mother, sister, sister, and sister.    ROS:  Please see the history of present illness.   Otherwise, review of systems are positive for none.   All other systems are reviewed and negative.    PHYSICAL EXAM: VS:  BP 100/72 (BP Location: Left Arm, Patient Position: Sitting, Cuff Size: Normal)   Pulse 74   Ht 5\' 7"  (1.702 m)   Wt 78.9 kg (174 lb)   LMP 07/14/2012   BMI 27.25 kg/m  , BMI Body mass index is 27.25 kg/m. GENERAL:  Well appearing HEENT:  Pupils equal round and reactive, fundi not visualized, oral mucosa unremarkable NECK:  No jugular venous distention, waveform within normal limits, carotid upstroke brisk and symmetric, no bruits, no thyromegaly LYMPHATICS:  No cervical adenopathy LUNGS:  Clear to auscultation bilaterally HEART:  RRR.  PMI not displaced or sustained,S1 and S2 within normal limits, no S3, no S4, no clicks, no rubs, no murmurs ABD:  Flat, positive bowel sounds normal in frequency in pitch, no bruits,  no rebound, no guarding, no midline pulsatile mass, no hepatomegaly, no splenomegaly EXT:  2 plus pulses throughout, no edema, no cyanosis no clubbing SKIN:  No rashes no nodules NEURO:  Cranial nerves II through XII grossly intact, motor grossly intact throughout PSYCH:  Cognitively intact, oriented to person place and time   EKG:  EKG is ordered today. The ekg ordered today demonstrates sinus rhythm. Rate 74 bpm. Nonspecific T wave abnormalities.   Recent Labs: No results found for requested labs within last 8760 hours.   06/01/17: Total cholesterol 227, triglycerides 201, HDL 54, LDL 133 TSH 1.97 WBC 4.1, hemoglobin 11.2, hematocrit 36.7, platelets 246    Sodium 144, potassium 3.7, BUN 8, creatinine 0.89   Lipid Panel    Component Value Date/Time   CHOL 185 11/07/2012 1445   TRIG 105 11/07/2012 1445   HDL 54 11/07/2012 1445   CHOLHDL 3.4 11/07/2012 1445   VLDL 21 11/07/2012 1445   LDLCALC 110 (H) 11/07/2012 1445      Wt Readings from Last 3 Encounters:  06/21/17 78.9 kg (174 lb)  02/26/15 74 kg (163 lb 4 oz)  08/18/13 68.9 kg (152 lb)      ASSESSMENT AND PLAN:  # Hyperlipidemia: Labs were not drawn fasting. She will return for repeat fasting labs. Assuming that her lipids were actually correct, her ASCVD ten-year risk is 1.5%. Therefore, it is unlikely that she will need aspirin or a statin.  She is interested in getting a coronary calcium score to better assess her cardiovascular risk.    Current medicines are reviewed at length with the patient today.  The patient does not have concerns regarding medicines.  The following changes have been made:  no change  Labs/ tests ordered today include:   Orders Placed This Encounter  Procedures  . CT CARDIAC SCORING  . Lipid panel  . EKG 12-Lead     Disposition:   FU with Tiffanny Lamarche C. Oval Linsey, MD, [facility] as needed.     This note was written with the assistance of speech recognition software.  Please excuse any transcriptional errors.  Signed, Anesa Fronek C. Oval Linsey, MD, [facility]  06/21/2017 6:27 PM    [facility]

## 2017-06-21 ENCOUNTER — Ambulatory Visit (INDEPENDENT_AMBULATORY_CARE_PROVIDER_SITE_OTHER): Payer: 59 | Admitting: Cardiovascular Disease

## 2017-06-21 ENCOUNTER — Encounter: Payer: Self-pay | Admitting: Cardiovascular Disease

## 2017-06-21 VITALS — BP 100/72 | HR 74 | Ht 67.0 in | Wt 174.0 lb

## 2017-06-21 DIAGNOSIS — E7800 Pure hypercholesterolemia, unspecified: Secondary | ICD-10-CM

## 2017-06-21 DIAGNOSIS — E559 Vitamin D deficiency, unspecified: Secondary | ICD-10-CM

## 2017-06-21 DIAGNOSIS — E785 Hyperlipidemia, unspecified: Secondary | ICD-10-CM

## 2017-06-21 HISTORY — DX: Vitamin D deficiency, unspecified: E55.9

## 2017-06-21 HISTORY — DX: Hyperlipidemia, unspecified: E78.5

## 2017-06-21 NOTE — Patient Instructions (Addendum)
Medication Instructions:  Your physician recommends that you continue on your current medications as directed. Please refer to the Current Medication list given to you today.  Labwork: FASTING LIPIDS SOON, ONLY WATER TO DRINK PRIOR   Testing/Procedures: CALCIUM SCORE   Follow-Up: AS NEEDED

## 2017-06-28 ENCOUNTER — Ambulatory Visit (INDEPENDENT_AMBULATORY_CARE_PROVIDER_SITE_OTHER)
Admission: RE | Admit: 2017-06-28 | Discharge: 2017-06-28 | Disposition: A | Discharge status: Home / Self Care | Payer: Self-pay | Source: Ambulatory Visit | Attending: Cardiovascular Disease | Admitting: Cardiovascular Disease

## 2017-06-28 DIAGNOSIS — E7800 Pure hypercholesterolemia, unspecified: Secondary | ICD-10-CM

## 2017-07-01 ENCOUNTER — Encounter: Payer: Self-pay | Admitting: *Deleted

## 2017-07-01 ENCOUNTER — Telehealth: Payer: Self-pay

## 2017-07-01 NOTE — Telephone Encounter (Signed)
Patient called asking for her CT to be released into her My-Chart. Megan Miles brought me the message. I called patient back at phone number provided in Epic it states number has been changed,disconnected, or no longer in service. No message left.

## 2017-07-12 ENCOUNTER — Encounter: Payer: Self-pay | Admitting: Gastroenterology

## 2017-08-06 ENCOUNTER — Ambulatory Visit (INDEPENDENT_AMBULATORY_CARE_PROVIDER_SITE_OTHER): Payer: 59 | Admitting: Allergy

## 2017-08-06 ENCOUNTER — Encounter: Payer: Self-pay | Admitting: Allergy

## 2017-08-06 VITALS — BP 110/70 | HR 75 | Temp 98.0°F | Resp 18 | Ht 67.0 in | Wt 177.4 lb

## 2017-08-06 DIAGNOSIS — L272 Dermatitis due to ingested food: Secondary | ICD-10-CM

## 2017-08-06 DIAGNOSIS — Z91018 Allergy to other foods: Secondary | ICD-10-CM

## 2017-08-06 DIAGNOSIS — K9049 Malabsorption due to intolerance, not elsewhere classified: Secondary | ICD-10-CM | POA: Diagnosis not present

## 2017-08-06 MED ORDER — EPINEPHRINE 0.3 MG/0.3ML IJ SOAJ: 0.3000 mg | Freq: Once | INTRAMUSCULAR | 1 refills | Status: AC

## 2017-08-06 NOTE — Patient Instructions (Signed)
Pruritus/Reaction with certain foods    - will obtain serum IgE levels for gluten based foods    - would recommend continued avoidance of foods/supplements you have developed pruritus/rash with     - would recommend looking at ingredients for similar ingredients across the supplements to determine if there is a additive/preservative that may be culprit  Food allergy   - continue avoidance of shellfish   - recommend you have an epinephrine device in case of severe allergic reaction   - follow food action plan in case of allergic reaction    Follow-up 1 year or sooner if needed

## 2017-08-06 NOTE — Progress Notes (Signed)
New Patient Note  RE: Megan Miles MRN: [id number] DOB: Nov 09, 1961 Date of Office Visit: 08/06/2017  Referring provider: Servando Salina, MD Primary care provider: Servando Salina, MD  Chief Complaint: rash  History of present illness: Megan Miles is a 56 y.o. female presenting today for consultation for rash.  She states she has identified several foods/supplements that she feels she is allergic too.  She states she has developed an itchy, patchy rash on her right elbow after eating certain things.  With MVI Nature's Made gummy she developed this rash over her elbow.  She stopped taking the gummy about 8 weeks ago.    She knows she is allergic to shellfish which she has been avoiding for the past 2 years as she would start itching on her elbow following ingestion.   She reports she is able to eat salmon, flounder, tuna and catfish.    She was taking Nature's Own Fish oil capsules and she reports she developed itchiness and rash also on her elbow as well thus she stopped taking this.  She is concerned mostly about gluten sensentivity as the MVI and several other supplements she has tried and developed the rash all reports contains wheat in the ingredients.  She also states with gluten ingestion besides developing rash she has bloatin.  She tries to limit amount of gluten she ingest.  She does have a GI appt in October.    She does not have an epipen.    She reports when she notices the rash she stops whatever she has taken and will use coconut oil on it.  She tries to use more natural products/ingredients to help her symptoms.    She has a dermatology appt next week as well.   She has had skin testing about 2 years ago with [facility] which she reports was for environmental allergy but everything was negative per pt.    She does report nasal congestion/drainage and itchy watery eyes during summer and zyrtec helps to control these symptoms.    She has no history of asthma or  eczema.    Review of systems: Review of Systems  Constitutional: Negative for chills, fever and malaise/fatigue.  HENT: Negative for congestion, ear discharge, ear pain, nosebleeds, sinus pain and sore throat.   Eyes: Negative for discharge and redness.  Respiratory: Negative for cough, shortness of breath and wheezing.   Cardiovascular: Negative for chest pain.  Gastrointestinal: Negative for abdominal pain, diarrhea, heartburn, nausea and vomiting.  Musculoskeletal: Negative for joint pain.  Skin: Positive for itching and rash.  Neurological: Negative for headaches.    All other systems negative unless noted above in HPI  Past medical history: Past Medical History:  Diagnosis Date  . Anemia    low iron  . Hyperlipidemia 06/21/2017  . Ovarian cyst   . Vitamin D deficiency 06/21/2017    Past surgical history: Past Surgical History:  Procedure Laterality Date  . LIPOSUCTION  '90s  . TOE SURGERY  '90s    Family history:  Family History  Problem Relation Age of Onset  . Diabetes Mother   . Hypertension Mother   . Cancer Brother        colon   . Colon cancer Brother   . Hypertension Sister   . Hypertension Sister   . Hypertension Sister     Social history: Pt did not complete social history questionnaire   Medication List: Allergies as of 08/06/2017      Reactions  Shellfish Allergy Itching, Swelling, Rash      Medication List       Accurate as of 08/06/17 10:03 PM. Always use your most recent med list.          Vitamin D (Ergocalciferol) 50000 units Caps capsule Commonly known as:  DRISDOL Take 1 capsule by mouth daily.         Known medication allergies: Allergies  Allergen Reactions  . Shellfish Allergy Itching, Swelling and Rash     Physical examination: Blood pressure 110/70, pulse 75, temperature 98 F (36.7 C), resp. rate 18, height 5\' 7"  (1.702 m), weight 177 lb 6.4 oz (80.5 kg), last menstrual period 07/14/2012, SpO2 97 %.  General:  Alert, interactive, in no acute distress. HEENT: TMs pearly gray, turbinates minimally edematous without discharge, post-pharynx non erythematous. Neck: Supple without lymphadenopathy. Lungs: Clear to auscultation without wheezing, rhonchi or rales. {no increased work of breathing. CV: Normal S1, S2 without murmurs. Abdomen: Nondistended, nontender. Skin: Warm and dry, without lesions or rashes. Extremities:  No clubbing, cyanosis or edema. Neuro:   Grossly intact.  Diagnositics/Labs: None today  Assessment and plan:   Pruritus/Reaction with certain foods/supplements    - will obtain serum IgE levels for gluten based foods as she is most concerned about gluten being culprit for rash development.      - would recommend continued avoidance of foods/supplements you have developed pruritus/rash with.  It is most likely that she has an intolerance and not IgE mediated allergy.  She also may be reacting to additives/preservatives.     - would recommend looking at ingredients for similar ingredients across the supplements to determine if there is a additive/preservative that may be leading to dermatitis  Food allergy   - continue avoidance of shellfish   - discussed retesting for shellfish however she would like to just continue avoidance at this time   - recommend you have an epinephrine device in case of severe allergic reaction   - follow food action plan in case of allergic reaction    Follow-up 1 year or sooner if needed   I appreciate the opportunity to take part in Terrianna's care. Please do not hesitate to contact me with questions.  Sincerely,   Prudy Feeler, MD Allergy/Immunology [facility]

## 2017-08-09 ENCOUNTER — Other Ambulatory Visit: Payer: Self-pay

## 2017-08-09 ENCOUNTER — Telehealth: Payer: Self-pay | Admitting: Allergy

## 2017-08-09 DIAGNOSIS — L309 Dermatitis, unspecified: Secondary | ICD-10-CM | POA: Diagnosis not present

## 2017-08-09 DIAGNOSIS — L818 Other specified disorders of pigmentation: Secondary | ICD-10-CM | POA: Diagnosis not present

## 2017-08-09 LAB — ALLERGEN, RYE, F5

## 2017-08-09 LAB — ALLERGEN, BAKERS YEAST, F45

## 2017-08-09 LAB — ALLERGEN, MALT,F90

## 2017-08-09 LAB — ALLERGEN BARLEY F6

## 2017-08-09 MED ORDER — EPINEPHRINE 0.3 MG/0.3ML IJ SOAJ: 0.3000 mg | Freq: Once | INTRAMUSCULAR | 1 refills | Status: AC

## 2017-08-09 MED FILL — EPIPEN 2-PAK 0.3 MG AUTO-IN: 0.3 | 30 days supply | Qty: 2 | Fill #0

## 2017-08-09 MED FILL — BETAMETHASONE DP AUG 0.05%: 0.05 | 20 days supply | Qty: 50 | Fill #0

## 2017-08-09 NOTE — Telephone Encounter (Signed)
Informed pt of my [facility]

## 2017-08-09 NOTE — Telephone Encounter (Signed)
[facility] will notify pt

## 2017-08-09 NOTE — Telephone Encounter (Signed)
Pt called and said that [facility] didi not have any epi-pens so she would like for you to see if [facility] [address] has any. [phone]

## 2017-08-13 MED FILL — VIT D2 1.25 MG (50,000 UNIT: 1.25 MG | 28 days supply | Qty: 4 | Fill #0

## 2017-09-14 ENCOUNTER — Encounter: Payer: Self-pay | Admitting: Gastroenterology

## 2017-09-14 ENCOUNTER — Ambulatory Visit (INDEPENDENT_AMBULATORY_CARE_PROVIDER_SITE_OTHER): Payer: 59 | Admitting: Gastroenterology

## 2017-09-14 VITALS — BP 110/80 | HR 72 | Ht 66.5 in | Wt 172.4 lb

## 2017-09-14 DIAGNOSIS — Z800 Family history of malignant neoplasm of digestive organs: Secondary | ICD-10-CM

## 2017-09-14 MED ORDER — NA SULFATE-K SULFATE-MG SULF 17.5-3.13-1.6 GM/177ML PO SOLN: 1.0000 | Freq: Once | ORAL | 0 refills | Status: AC

## 2017-09-14 MED FILL — SUPREP BOWEL PREP KIT: 17.5-3.13-1 | 1 days supply | Qty: 354 | Fill #0

## 2017-09-14 NOTE — Progress Notes (Signed)
Review of pertinent gastrointestinal problems: 1. FH of colon cancer: brother died in his 37s from colon cancer.  Colonoscopy 03/2008 Dr. Ardis Hughs was normal.  She was recommended to have repeat screening at 5 years. Reminder letters sent 03/2013 and 04/2014.   HPI: This is a pleasant 56 year old woman who was self-referred  Chief complaint is brother with colon cancer  Her brother died of colon cancer in his 66s. She believes that was because he had a very poor diet and never exercise.  She has no overt GI bleeding, no significant weight changes, no changes in her bowels.  She's feeling well overall.  Excercises a lot.  She has a lot of bloating, especially with fruits.  Juices a lot.  Has not tried gas-ex.    Old Data Reviewed:     Review of systems: Pertinent positive and negative review of systems were noted in the above HPI section. All other review negative.   Past Medical History:  Diagnosis Date  . Anemia    low iron  . Hyperlipidemia 06/21/2017  . Ovarian cyst   . Vitamin D deficiency 06/21/2017    Past Surgical History:  Procedure Laterality Date  . LIPOSUCTION  '90s  . TOE SURGERY  '90s    Current Outpatient Prescriptions  Medication Sig Dispense Refill  . Multiple Vitamin (MULTIVITAMIN) tablet Take 1 tablet by mouth daily.     No current facility-administered medications for this visit.     Allergies as of 09/14/2017 - Review Complete 09/14/2017  Allergen Reaction Noted  . Shellfish allergy Itching, Swelling, and Rash 05/19/2016    Family History  Problem Relation Age of Onset  . Diabetes Mother   . Hypertension Mother   . Colon cancer Brother   . Hypertension Sister   . Hypertension Sister   . Hypertension Sister     Social History   Social History  . Marital status: Married    Spouse name: N/A  . Number of children: 0  . Years of education: N/A   Occupational History  . Not on file.   Social History Main Topics  . Smoking status:  Never Smoker  . Smokeless tobacco: Never Used  . Alcohol use 0.0 oz/week     Comment: occaional  . Drug use: No  . Sexual activity: No     Comment: perimenopausal    Other Topics Concern  . Not on file   Social History Narrative   ** Merged History Encounter **         Physical Exam: BP 110/80 (BP Location: Left Arm, Patient Position: Sitting, Cuff Size: Normal)   Pulse 72   Ht 5' 6.5" (1.689 m) Comment: height measured without shoes  Wt 172 lb 6 oz (78.2 kg)   LMP 07/14/2012   BMI 27.41 kg/m  Constitutional: generally well-appearing Psychiatric: alert and oriented x3 Eyes: extraocular movements intact Mouth: oral pharynx moist, no lesions Neck: supple no lymphadenopathy Cardiovascular: heart regular rate and rhythm Lungs: clear to auscultation bilaterally Abdomen: soft, nontender, nondistended, no obvious ascites, no peritoneal signs, normal bowel sounds Extremities: no lower extremity edema bilaterally Skin: no lesions on visible extremities   Assessment and plan: 56 y.o. female with  significant family history of colon cancer  Her brother died of colon cancer in his 69s. I recommended we repeat her screening at this point. We'll go and schedule her for colonoscopy at her soonest convenience. I see no reason for any further blood tests or imaging studies prior to then.  Please see the "Patient Instructions" section for addition details about the plan.   Owens Loffler, MD [facility] 09/14/2017, 3:21 PM  Cc: Servando Salina, MD

## 2017-09-14 NOTE — Patient Instructions (Addendum)
You will be set up for a colonoscopy for FH of colon cancer (brother in his 105s).  Normal BMI (Body Mass Index- based on height and weight) is between 19 and 25. Your BMI today is Body mass index is 27.41 kg/m. Megan Miles Please consider follow up  regarding your BMI with your Primary Care Provider.

## 2017-09-15 DIAGNOSIS — Z13220 Encounter for screening for lipoid disorders: Secondary | ICD-10-CM | POA: Diagnosis not present

## 2017-09-15 DIAGNOSIS — E559 Vitamin D deficiency, unspecified: Secondary | ICD-10-CM | POA: Diagnosis not present

## 2017-09-17 ENCOUNTER — Telehealth: Payer: Self-pay | Admitting: Allergy

## 2017-09-17 ENCOUNTER — Telehealth: Payer: Self-pay | Admitting: Gastroenterology

## 2017-09-17 MED ORDER — NA SULFATE-K SULFATE-MG SULF 17.5-3.13-1.6 GM/177ML PO SOLN: 1.0000 | Freq: Once | ORAL | 0 refills | Status: AC

## 2017-09-17 NOTE — Telephone Encounter (Signed)
Prescription sent

## 2017-09-17 NOTE — Telephone Encounter (Signed)
Left message that DOS 08-06-17 went to her deductible - there was no insurance payment - kt

## 2017-09-17 NOTE — Telephone Encounter (Signed)
Received bill and would like to know if insurance covered any of it.

## 2017-10-21 ENCOUNTER — Encounter: Payer: Self-pay | Admitting: Gastroenterology

## 2017-10-29 ENCOUNTER — Other Ambulatory Visit: Payer: Self-pay

## 2017-10-29 ENCOUNTER — Ambulatory Visit (AMBULATORY_SURGERY_CENTER): Payer: 59 | Admitting: Gastroenterology

## 2017-10-29 ENCOUNTER — Encounter: Payer: Self-pay | Admitting: Gastroenterology

## 2017-10-29 VITALS — BP 101/65 | HR 63 | Temp 96.2°F | Resp 16 | Ht 66.5 in | Wt 172.0 lb

## 2017-10-29 DIAGNOSIS — Z1211 Encounter for screening for malignant neoplasm of colon: Secondary | ICD-10-CM | POA: Diagnosis present

## 2017-10-29 DIAGNOSIS — D122 Benign neoplasm of ascending colon: Secondary | ICD-10-CM | POA: Diagnosis not present

## 2017-10-29 DIAGNOSIS — Z800 Family history of malignant neoplasm of digestive organs: Secondary | ICD-10-CM

## 2017-10-29 DIAGNOSIS — D125 Benign neoplasm of sigmoid colon: Secondary | ICD-10-CM | POA: Diagnosis not present

## 2017-10-29 MED ORDER — SODIUM CHLORIDE 0.9 % IV SOLN: 500.0000 mL | INTRAVENOUS | Status: DC

## 2017-10-29 NOTE — Progress Notes (Signed)
To PACU, VSS. Report to [email or web address] 

## 2017-10-29 NOTE — Op Note (Signed)
[place] Patient Name: Megan Miles Procedure Date: 10/29/2017 2:08 PM MRN: [id number] Endoscopist: Milus Banister , MD Age: 56 Referring MD:  Date of Birth: 17-Apr-1961 Gender: Female Account #: [id number] Procedure:                Colonoscopy Indications:              Screening in patient at increased risk: Family                            history of 1st-degree relative with colorectal                            cancer before age 69 years: FH of colon cancer:                            brother died in his 27s from colon cancer.                            Colonoscopy 4/2009Dr. Ardis Hughs was normal. Medicines:                Monitored Anesthesia Care Procedure:                Pre-Anesthesia Assessment:                           - Prior to the procedure, a History and Physical                            was performed, and patient medications and                            allergies were reviewed. The patient's tolerance of                            previous anesthesia was also reviewed. The risks                            and benefits of the procedure and the sedation                            options and risks were discussed with the patient.                            All questions were answered, and informed consent                            was obtained. Prior Anticoagulants: The patient has                            taken no previous anticoagulant or antiplatelet                            agents. ASA Grade Assessment: II - A patient with  mild systemic disease. After reviewing the risks                            and benefits, the patient was deemed in                            satisfactory condition to undergo the procedure.                           After obtaining informed consent, the colonoscope                            was passed under direct vision. Throughout the                            procedure, the patient's blood  pressure, pulse, and                            oxygen saturations were monitored continuously. The                            Colonoscope was introduced through the anus and                            advanced to the the cecum, identified by                            appendiceal orifice and ileocecal valve. The                            colonoscopy was performed without difficulty. The                            patient tolerated the procedure well. The quality                            of the bowel preparation was excellent. The                            ileocecal valve, appendiceal orifice, and rectum                            were photographed. Scope In: 2:22:09 PM Scope Out: 2:33:19 PM Scope Withdrawal Time: 0 hours 8 minutes 39 seconds  Total Procedure Duration: 0 hours 11 minutes 10 seconds  Findings:                 Two sessile polyps were found in the sigmoid colon                            and ascending colon. The polyps were 2 to 3 mm in                            size. These polyps were removed with a cold snare.  Resection and retrieval were complete.                           Small internal hemorrhoids.                           The exam was otherwise without abnormality on                            direct and retroflexion views. Complications:            No immediate complications. Estimated blood loss:                            None. Estimated Blood Loss:     Estimated blood loss: none. Impression:               - Two 2 to 3 mm polyps in the sigmoid colon and in                            the ascending colon, removed with a cold snare.                            Resected and retrieved.                           - Small internal hemorrhoids.                           - The examination was otherwise normal on direct                            and retroflexion views. Recommendation:           - Patient has a contact number available for                             emergencies. The signs and symptoms of potential                            delayed complications were discussed with the                            patient. Return to normal activities tomorrow.                            Written discharge instructions were provided to the                            patient.                           - Resume previous diet.                           - Continue present medications.  You will receive a letter within 2-3 weeks with the                            pathology results and my final recommendations.                           If the polyp(s) is proven to be 'pre-cancerous' on                            pathology, you will need repeat colonoscopy in 5                            years. Milus Banister, MD 10/29/2017 2:36:00 PM This report has been signed electronically.

## 2017-10-29 NOTE — Progress Notes (Signed)
Called to room to assist during endoscopic procedure.  Patient ID and intended procedure confirmed with present staff. Received instructions for my participation in the procedure from the performing physician.  

## 2017-10-29 NOTE — Patient Instructions (Signed)
Impression/Recommendations:  Polyp handout given to patient. Hemorrhoid handout given to patient.   Resume previous diet. Continue present medications.  Repeat colonoscopy recommended.   Date to be determined after pathology results reviewed.  YOU HAD AN ENDOSCOPIC PROCEDURE TODAY AT [facility]:   Refer to the procedure report that was given to you for any specific questions about what was found during the examination.  If the procedure report does not answer your questions, please call your gastroenterologist to clarify.  If you requested that your care partner not be given the details of your procedure findings, then the procedure report has been included in a sealed envelope for you to review at your convenience later.  YOU SHOULD EXPECT: Some feelings of bloating in the abdomen. Passage of more gas than usual.  Walking can help get rid of the air that was put into your GI tract during the procedure and reduce the bloating. If you had a lower endoscopy (such as a colonoscopy or flexible sigmoidoscopy) you may notice spotting of blood in your stool or on the toilet paper. If you underwent a bowel prep for your procedure, you may not have a normal bowel movement for a few days.  Please Note:  You might notice some irritation and congestion in your nose or some drainage.  This is from the oxygen used during your procedure.  There is no need for concern and it should clear up in a day or so.  SYMPTOMS TO REPORT IMMEDIATELY:   Following lower endoscopy (colonoscopy or flexible sigmoidoscopy):  Excessive amounts of blood in the stool  Significant tenderness or worsening of abdominal pains  Swelling of the abdomen that is new, acute  Fever of 100F or higher For urgent or emergent issues, a gastroenterologist can be reached at any hour by calling [phone].   DIET:  We do recommend a small meal at first, but then you may proceed to your regular diet.  Drink plenty of  fluids but you should avoid alcoholic beverages for 24 hours.  ACTIVITY:  You should plan to take it easy for the rest of today and you should NOT DRIVE or use heavy machinery until tomorrow (because of the sedation medicines used during the test).    FOLLOW UP: Our staff will call the number listed on your records the next business day following your procedure to check on you and address any questions or concerns that you may have regarding the information given to you following your procedure. If we do not reach you, we will leave a message.  However, if you are feeling well and you are not experiencing any problems, there is no need to return our call.  We will assume that you have returned to your regular daily activities without incident.  If any biopsies were taken you will be contacted by phone or by letter within the next 1-3 weeks.  Please call us at [phone] if you have not heard about the biopsies in 3 weeks.    SIGNATURES/CONFIDENTIALITY: You and/or your care partner have signed paperwork which will be entered into your electronic medical record.  These signatures attest to the fact that that the information above on your After Visit Summary has been reviewed and is understood.  Full responsibility of the confidentiality of this discharge information lies with you and/or your care-partner.

## 2017-11-01 ENCOUNTER — Telehealth: Payer: Self-pay

## 2017-11-01 ENCOUNTER — Telehealth: Payer: Self-pay | Admitting: *Deleted

## 2017-11-01 NOTE — Telephone Encounter (Signed)
  Follow up Call-  Call [name] number 10/29/2017  Post procedure Call [name] phone  # [phone]  Permission to leave phone message Yes  Some recent data might be hidden     Patient questions:  Do you have a fever, pain , or abdominal swelling? No. Pain Score  0 *  Have you tolerated food without any problems? Yes.    Have you been able to return to your normal activities? Yes.    Do you have any questions about your discharge instructions: Diet   No. Medications  No. Follow up visit  No.  Do you have questions or concerns about your Care? No.  Actions: * If pain score is 4 or above: No action needed, pain <4.

## 2017-11-01 NOTE — Telephone Encounter (Signed)
Lm we will return call later today  Lenard Galloway RN

## 2017-11-07 ENCOUNTER — Encounter: Payer: Self-pay | Admitting: Gastroenterology

## 2017-11-15 ENCOUNTER — Ambulatory Visit (INDEPENDENT_AMBULATORY_CARE_PROVIDER_SITE_OTHER): Payer: 59 | Admitting: Podiatry

## 2017-11-15 ENCOUNTER — Encounter: Payer: Self-pay | Admitting: Podiatry

## 2017-11-15 DIAGNOSIS — M216X1 Other acquired deformities of right foot: Secondary | ICD-10-CM

## 2017-11-15 DIAGNOSIS — M2041 Other hammer toe(s) (acquired), right foot: Secondary | ICD-10-CM | POA: Diagnosis not present

## 2017-11-15 DIAGNOSIS — M216X2 Other acquired deformities of left foot: Secondary | ICD-10-CM

## 2017-11-15 DIAGNOSIS — M6700 Short Achilles tendon (acquired), unspecified ankle: Secondary | ICD-10-CM | POA: Diagnosis not present

## 2017-11-15 DIAGNOSIS — M21969 Unspecified acquired deformity of unspecified lower leg: Secondary | ICD-10-CM
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
## 2017-11-15 NOTE — Patient Instructions (Signed)
Seen for hammer toe deformity right foot. Noted of tight Achilles tendon bilateral, weak first metatarsal bone bilateral. Referral for physical therapy placed. Both feet casted for orthotics.

## 2017-11-19 DIAGNOSIS — M79604 Pain in right leg: Secondary | ICD-10-CM | POA: Diagnosis not present

## 2017-11-19 DIAGNOSIS — I8311 Varicose veins of right lower extremity with inflammation: Secondary | ICD-10-CM | POA: Diagnosis not present

## 2017-11-19 DIAGNOSIS — M79605 Pain in left leg: Secondary | ICD-10-CM | POA: Diagnosis not present

## 2017-11-26 MED FILL — SF 5000 PLUS CREAM: 1.1 | 30 days supply | Qty: 51 | Fill #0

## 2017-12-09 DIAGNOSIS — M79604 Pain in right leg: Secondary | ICD-10-CM | POA: Diagnosis not present

## 2017-12-09 DIAGNOSIS — M79605 Pain in left leg: Secondary | ICD-10-CM | POA: Diagnosis not present

## 2017-12-09 DIAGNOSIS — I8311 Varicose veins of right lower extremity with inflammation: Secondary | ICD-10-CM | POA: Diagnosis not present

## 2018-01-14 DIAGNOSIS — I8311 Varicose veins of right lower extremity with inflammation: Secondary | ICD-10-CM | POA: Diagnosis not present

## 2018-01-14 DIAGNOSIS — I8312 Varicose veins of left lower extremity with inflammation: Secondary | ICD-10-CM | POA: Diagnosis not present

## 2018-02-11 MED FILL — SF 5000 PLUS CREAM: 1.1 | 30 days supply | Qty: 51 | Fill #1

## 2018-02-18 ENCOUNTER — Emergency Department (HOSPITAL_COMMUNITY)
Admission: EM | Admit: 2018-02-18 | Discharge: 2018-02-18 | Disposition: A | Discharge status: Home / Self Care | Payer: 59 | Attending: Emergency Medicine | Admitting: Emergency Medicine

## 2018-02-18 ENCOUNTER — Encounter (HOSPITAL_COMMUNITY): Payer: Self-pay

## 2018-02-18 ENCOUNTER — Other Ambulatory Visit: Payer: Self-pay

## 2018-02-18 ENCOUNTER — Emergency Department (HOSPITAL_COMMUNITY): Payer: 59

## 2018-02-18 DIAGNOSIS — Z79899 Other long term (current) drug therapy: Secondary | ICD-10-CM | POA: Diagnosis not present

## 2018-02-18 DIAGNOSIS — R1012 Left upper quadrant pain: Secondary | ICD-10-CM

## 2018-02-18 DIAGNOSIS — D259 Leiomyoma of uterus, unspecified: Secondary | ICD-10-CM | POA: Diagnosis not present

## 2018-02-18 DIAGNOSIS — R109 Unspecified abdominal pain: Secondary | ICD-10-CM | POA: Diagnosis not present

## 2018-02-18 LAB — CBC
HEMATOCRIT: 36.5 % (ref 36.0–46.0)
HEMOGLOBIN: 11.2 g/dL — AB (ref 12.0–15.0)
MCH: 27.6 pg (ref 26.0–34.0)
MCHC: 30.7 g/dL (ref 30.0–36.0)
MCV: 89.9 fL (ref 78.0–100.0)
Platelets: 268 10*3/uL (ref 150–400)
RBC: 4.06 MIL/uL (ref 3.87–5.11)
RDW: 13 % (ref 11.5–15.5)
WBC: 4 10*3/uL (ref 4.0–10.5)

## 2018-02-18 LAB — COMPREHENSIVE METABOLIC PANEL
ALK PHOS: 80 U/L (ref 38–126)
ALT: 39 U/L (ref 14–54)
ANION GAP: 9 (ref 5–15)
AST: 36 U/L (ref 15–41)
Albumin: 4 g/dL (ref 3.5–5.0)
BILIRUBIN TOTAL: 0.7 mg/dL (ref 0.3–1.2)
BUN: 6 mg/dL (ref 6–20)
CALCIUM: 9.1 mg/dL (ref 8.9–10.3)
CO2: 27 mmol/L (ref 22–32)
Chloride: 103 mmol/L (ref 101–111)
Creatinine, Ser: 1.05 mg/dL — ABNORMAL HIGH (ref 0.44–1.00)
GFR, EST NON AFRICAN AMERICAN: 58 mL/min — AB (ref >60)
GLUCOSE: 88 mg/dL (ref 65–99)
POTASSIUM: 3.7 mmol/L (ref 3.5–5.1)
Sodium: 139 mmol/L (ref 135–145)
TOTAL PROTEIN: 6.4 g/dL — AB (ref 6.5–8.1)

## 2018-02-18 LAB — I-STAT BETA HCG BLOOD, ED (MC, WL, AP ONLY)

## 2018-02-18 LAB — LIPASE, BLOOD: Lipase: 45 U/L (ref 11–51)

## 2018-02-18 MED ORDER — IOPAMIDOL (ISOVUE-300) INJECTION 61%
INTRAVENOUS | Status: AC
  Administered 2018-02-18: 100 mL
  Filled 2018-02-18: qty 100

## 2018-02-18 NOTE — ED Notes (Signed)
Patient transported to CT 

## 2018-02-18 NOTE — ED Provider Notes (Signed)
[facility]ENC[facility] DEPARTMENT Provider Note   CSN: [id number] Arrival date & time: 02/18/18  1435     History   Chief Complaint Chief Complaint  Patient presents with  . Abdominal Pain    HPI Megan Miles is a 57 y.o. female.  The history is provided by the patient. No language interpreter was used.  Abdominal Pain   This is a new problem. The current episode started 12 to 24 hours ago. The problem occurs constantly. The problem has not changed since onset.The pain is located in the LUQ. The quality of the pain is aching. The pain is severe. Pertinent negatives include fever. Nothing aggravates the symptoms. Nothing relieves the symptoms.  Pt reports she feels a knot in her left pper abdomen.  Pt reports she had severe pain earlier.  Pt reports pain is decreasing.   Past Medical History:  Diagnosis Date  . Anemia    low iron  . Hyperlipidemia 06/21/2017  . Ovarian cyst   . Vitamin D deficiency 06/21/2017    Patient Active Problem List   Diagnosis Date Noted  . Hyperlipidemia 06/21/2017  . Vitamin D deficiency 06/21/2017    Past Surgical History:  Procedure Laterality Date  . LIPOSUCTION  '90s  . TOE SURGERY  '90s    OB History    Gravida Para Term Preterm AB Living   1 0 0 0 1     SAB TAB Ectopic Multiple Live Births   1 0 0           Home Medications    Prior to Admission medications   Medication Sig Start Date End Date Taking? Authorizing Provider  cholecalciferol (VITAMIN D) 1000 units tablet Take 5,000 Units daily by mouth.   Yes [provider]  COLLAGEN PO Take 1 tablet by mouth daily.   Yes [provider]  Multiple Vitamin (MULTIVITAMIN) tablet Take 1 tablet by mouth daily.   Yes [provider]  SF 5000 PLUS 1.1 % CREA dental cream Inhale 1 application into the lungs daily. 02/11/18  Yes [provider]    Family History Family History  Problem Relation Age of Onset  . Diabetes Mother   .  Hypertension Mother   . Colon cancer Brother   . Hypertension Sister   . Hypertension Sister   . Hypertension Sister   . Esophageal cancer Neg Hx   . Stomach cancer Neg Hx     Social History Social History   Tobacco Use  . Smoking status: Never Smoker  . Smokeless tobacco: Never Used  Substance Use Topics  . Alcohol use: Yes    Alcohol/week: 0.0 oz    Comment: occaional  . Drug use: No     Allergies   Shellfish allergy   Review of Systems Review of Systems  Constitutional: Negative for fever.  Gastrointestinal: Positive for abdominal pain.  All other systems reviewed and are negative.    Physical Exam Updated Vital Signs BP 107/68 (BP Location: Left Arm)   Pulse 65   Temp (!) 97.4 F (36.3 C) (Oral)   Resp 18   Ht 5\' 7"  (1.702 m)   Wt 77.1 kg (170 lb)   LMP 07/14/2012   SpO2 99%   BMI 26.63 kg/m   Physical Exam  Constitutional: She appears well-developed and well-nourished. No distress.  HENT:  Head: Normocephalic and atraumatic.  Eyes: Conjunctivae are normal.  Neck: Neck supple.  Cardiovascular: Normal rate and regular rhythm.  No  murmur heard. Pulmonary/Chest: Effort normal and breath sounds normal. No respiratory distress.  Abdominal: Soft. Bowel sounds are normal. She exhibits no pulsatile midline mass and no mass. There is tenderness in the left upper quadrant. There is guarding. There is no CVA tenderness.  Musculoskeletal: She exhibits no edema.  Neurological: She is alert.  Skin: Skin is warm and dry.  Psychiatric: She has a normal mood and affect.  Nursing note and vitals reviewed.    ED Treatments / Results  Labs (all labs ordered are listed, but only abnormal results are displayed) Labs Reviewed  COMPREHENSIVE METABOLIC PANEL - Abnormal; Notable for the following components:      Result Value   Creatinine, Ser 1.05 (*)    Total Protein 6.4 (*)    GFR calc non Af Amer 58 (*)    All other components within normal limits  CBC -  Abnormal; Notable for the following components:   Hemoglobin 11.2 (*)    All other components within normal limits  LIPASE, BLOOD  URINALYSIS, ROUTINE W REFLEX MICROSCOPIC  I-STAT BETA HCG BLOOD, ED ([facility], [facility], [facility] ONLY)    EKG  EKG Interpretation None       Radiology Ct Abdomen Pelvis W Contrast  Result Date: 02/18/2018 CLINICAL DATA:  57 year old female with acute LEFT abdominal pain and swelling today. EXAM: CT ABDOMEN AND PELVIS WITH CONTRAST TECHNIQUE: Multidetector CT imaging of the abdomen and pelvis was performed using the standard protocol following bolus administration of intravenous contrast. CONTRAST:  100 cc intravenous Isovue-300 COMPARISON:  None. FINDINGS: Lower chest: No acute abnormality Hepatobiliary: The liver and gallbladder are unremarkable. No biliary dilatation. Pancreas: Unremarkable Spleen: Unremarkable Adrenals/Urinary Tract: The kidneys, adrenal glands and bladder are unremarkable. Stomach/Bowel: Stomach is within normal limits. Appendix appears normal. No evidence of bowel wall thickening, distention, or inflammatory changes. Vascular/Lymphatic: No significant vascular findings are present. No enlarged abdominal or pelvic lymph nodes. Reproductive: Apparent multiple uterine masses probably represent fibroids. No suspicious adnexal abnormalities. Other: No ascites, focal collection, pneumoperitoneum or abdominal wall hernia. Musculoskeletal: No acute bony abnormality or suspicious bony lesion. IMPRESSION: 1. No evidence of acute abnormality 2. Apparent multiple uterine masses likely representing fibroids. Consider confirmation with elective pelvic ultrasound if no prior outside studies are available for comparison. Electronically Signed   By: Margarette Canada M.D.   On: 02/18/2018 20:19    Procedures Procedures (including critical care time)  Medications Ordered in ED Medications  iopamidol (ISOVUE-300) 61 % injection (100 mLs  Contrast Given 02/18/18 1952)     Initial  Impression / Assessment and Plan / ED Course  I have reviewed the triage vital signs and the nursing notes.  Pertinent labs & imaging results that were available during my care of the patient were reviewed by me and considered in my medical decision making (see chart for details).     MDM  Pt's ct reviewed and discussed with pt.  Labs reviewed,  No acute abnormalities.   I discussed possibility of possible development of shingles.  Pt is advised to watch for rash.  Pt advised to see her Gyn for recheck of fibroids.  Pt reports she has had fibroids for several years.  Final Clinical Impressions(s) / ED Diagnoses   Final diagnoses:  Uterine leiomyoma, unspecified location  Left upper quadrant pain    ED Discharge Orders    None    Tylenol for pain An After Visit Summary was printed and given to the patient.    Caryl Ada  K, PA-C 02/18/18 2211    Drenda Freeze, MD 02/21/18 [phone]

## 2018-02-18 NOTE — ED Triage Notes (Signed)
Patient comes in after having pain in her lower upper left abdominal pain, states she feels a knot there.  Patient denies any nausea or vomiting.  A&Ox4

## 2018-02-18 NOTE — Discharge Instructions (Addendum)
See Dr. Garwin Brothers for recheck next week.   Have her review Ct scan due to fibroids.   Return if any problems.

## 2018-02-18 NOTE — ED Notes (Signed)
Pt verbalizes understanding of d/c instructions. Pt ambulatory at d/c with all belongings.   

## 2018-03-04 ENCOUNTER — Telehealth: Payer: Self-pay | Admitting: *Deleted

## 2018-03-04 NOTE — Telephone Encounter (Signed)
Patient came in the office inquiring about her orthotics. She states she never heard back. We did have her orthotics here and a call was attempted twice. Her voice mail was full both times. Patient pain $300.00 for her orthotics today.

## 2018-03-18 DIAGNOSIS — J029 Acute pharyngitis, unspecified: Secondary | ICD-10-CM | POA: Diagnosis not present

## 2018-03-18 MED FILL — AZITHROMYCIN 500 MG TABLET: 500 | 5 days supply | Qty: 5 | Fill #0

## 2018-03-21 ENCOUNTER — Encounter: Payer: Self-pay | Admitting: *Deleted

## 2018-03-28 DIAGNOSIS — L049 Acute lymphadenitis, unspecified: Secondary | ICD-10-CM | POA: Diagnosis not present

## 2018-03-28 MED FILL — SULFAMETHOXAZOLE-TMP DS TAB: 800-160 | 10 days supply | Qty: 20 | Fill #0

## 2018-04-12 DIAGNOSIS — L049 Acute lymphadenitis, unspecified: Secondary | ICD-10-CM | POA: Diagnosis not present

## 2018-06-03 DIAGNOSIS — L049 Acute lymphadenitis, unspecified: Secondary | ICD-10-CM | POA: Diagnosis not present

## 2018-06-14 ENCOUNTER — Encounter: Payer: Self-pay | Admitting: Family Medicine

## 2018-06-14 ENCOUNTER — Ambulatory Visit (INDEPENDENT_AMBULATORY_CARE_PROVIDER_SITE_OTHER): Payer: 59 | Admitting: Family Medicine

## 2018-06-14 VITALS — BP 100/66 | HR 76 | Temp 97.6°F | Resp 16 | Ht 67.0 in | Wt 174.0 lb

## 2018-06-14 DIAGNOSIS — G4700 Insomnia, unspecified: Secondary | ICD-10-CM

## 2018-06-14 DIAGNOSIS — Z7689 Persons encountering health services in other specified circumstances: Secondary | ICD-10-CM

## 2018-06-14 DIAGNOSIS — E7800 Pure hypercholesterolemia, unspecified: Secondary | ICD-10-CM | POA: Diagnosis not present

## 2018-06-14 MED ORDER — TRAZODONE HCL 50 MG PO TABS: 50.0000 mg | ORAL_TABLET | Freq: Every evening | ORAL | 1 refills | Status: DC | PRN

## 2018-06-14 MED FILL — traZODone HCL 50 MG TABS: 50 | 30 days supply | Qty: 30 | Fill #0

## 2018-06-14 NOTE — Progress Notes (Signed)
Subjective:    Patient ID: Megan Miles, female    DOB: Aug 01, 1961, 57 y.o.   MRN: [id number]  HPI Patient is here today to establish care.  Patient is a 57 year old African-American female who works as an Scientist, physiological through the hospital system.  Her main goal is to establish care with primary care physician.  She also complains of insomnia.  Problem began several months ago.  Last year, the patient's husband was diagnosed with colon cancer.  They moved her into their home to care for her.  She passed recently.  Because of the stress of caring for a level 1 battling cancer, the patient developed significant stress-induced insomnia.  She states that now she has a difficult time falling asleep at night.  She has a difficult time turning her mind off.  She denies any depression.  She denies any anxiety.  She denies any anhedonia or suicidal thoughts.  He is tried melatonin over-the-counter with no benefit.  She is very hesitant to use medication however the insomnia is getting worse and now she is willing to treat.  She sees a gynecologist who performs her mammogram and Pap smears which are up-to-date.  She is been getting regular colonoscopies since she was in her 25s initially due to blood in her stool and then due to her family history of having a brother died from colon cancer in his mid 36s.  Her most recent colonoscopy was significant for small polyps and it was recommended that she have a repeat colonoscopy in 5 years.  Her gastroenterologist is Dr. Ardis Hughs.  She does have a past medical history of hyperlipidemia however she is on no medication for this.  She is due for the shingles vaccine. Past Medical History:  Diagnosis Date  . Anemia    low iron  . Hyperlipidemia 06/21/2017  . Ovarian cyst   . Vitamin D deficiency 06/21/2017   Past Surgical History:  Procedure Laterality Date  . LIPOSUCTION  '90s  . TOE SURGERY  '90s   Current Outpatient Medications on File Prior to Visit  Medication Sig  Dispense Refill  . BLACK CURRANT SEED OIL PO Take by mouth.    . cholecalciferol (VITAMIN D) 1000 units tablet Take 5,000 Units daily by mouth.    . COLLAGEN PO Take 1 tablet by mouth daily.    . Multiple Vitamin (MULTIVITAMIN) tablet Take 1 tablet by mouth daily.     No current facility-administered medications on file prior to visit.    Allergies  Allergen Reactions  . Shellfish Allergy Itching, Swelling and Rash   Social History   Socioeconomic History  . Marital status: Married    Spouse name: Not on file  . Number of children: 0  . Years of education: Not on file  . Highest education level: Not on file  Occupational History  . Not on file  Social Needs  . Financial resource strain: Not on file  . Food insecurity:    Worry: Not on file    Inability: Not on file  . Transportation needs:    Medical: Not on file    Non-medical: Not on file  Tobacco Use  . Smoking status: Never Smoker  . Smokeless tobacco: Never Used  Substance and Sexual Activity  . Alcohol use: Yes    Alcohol/week: 0.0 oz    Comment: occaional  . Drug use: No  . Sexual activity: Never    Birth control/protection: None    Comment: perimenopausal  Lifestyle  . Physical activity:    Days per week: Not on file    Minutes per session: Not on file  . Stress: Not on file  Relationships  . Social connections:    Talks on phone: Not on file    Gets together: Not on file    Attends religious service: Not on file    Active member of club or organization: Not on file    Attends meetings of clubs or organizations: Not on file    Relationship status: Not on file  . Intimate partner violence:    Fear of current or ex partner: Not on file    Emotionally abused: Not on file    Physically abused: Not on file    Forced sexual activity: Not on file  Other Topics Concern  . Not on file  Social History Narrative   ** Merged History Encounter **       Family History  Problem Relation Age of Onset  .  Diabetes Mother   . Hypertension Mother   . Colon cancer Brother   . Cancer Brother        colon at 43  . Hypertension Sister   . Hypertension Sister   . Hypertension Sister   . Esophageal cancer Neg Hx   . Stomach cancer Neg Hx       Review of Systems  All other systems reviewed and are negative.      Objective:   Physical Exam  Constitutional: She is oriented to person, place, and time. She appears well-developed and well-nourished. No distress.  HENT:  Head: Normocephalic and atraumatic.  Right Ear: External ear normal.  Left Ear: External ear normal.  Nose: Nose normal.  Mouth/Throat: Oropharynx is clear and moist. No oropharyngeal exudate.  Eyes: Pupils are equal, round, and reactive to light. Conjunctivae and EOM are normal. Right eye exhibits no discharge. Left eye exhibits no discharge.  Neck: Normal range of motion. Neck supple. No JVD present. No thyromegaly present.  Cardiovascular: Normal rate, regular rhythm and normal heart sounds. Exam reveals no gallop and no friction rub.  No murmur heard. Pulmonary/Chest: Effort normal and breath sounds normal. No stridor. No respiratory distress. She has no wheezes. She has no rales.  Abdominal: Soft. Bowel sounds are normal. She exhibits no distension and no mass. There is no tenderness. There is no rebound and no guarding. No hernia.  Musculoskeletal: Normal range of motion. She exhibits no edema, tenderness or deformity.  Lymphadenopathy:    She has no cervical adenopathy.  Neurological: She is alert and oriented to person, place, and time. She displays normal reflexes. No cranial nerve deficit or sensory deficit. She exhibits normal muscle tone. Coordination normal.  Skin: She is not diaphoretic.  Vitals reviewed.         Assessment & Plan:  Encounter to establish care with new doctor  Insomnia, unspecified type  Pure hypercholesterolemia - Plan: COMPLETE METABOLIC PANEL WITH GFR, Lipid panel  Patient's exam  today is normal.  Her blood pressure is excellent.  I have asked her to return fasting for a CMP as well as a fasting lipid panel to determine the extent of her hyperlipidemia determine whether treatment is necessary.  Her mammogram and Pap smear up-to-date and provided by her gynecologist.  Her colonoscopy is up-to-date.  We did discuss that I recommended the shingles vaccine, Shingrix.  We will try trazodone 50 mg p.o. nightly for insomnia due to his low risk for possible habituation  and dependency.

## 2018-06-20 ENCOUNTER — Other Ambulatory Visit: Payer: 59

## 2018-07-20 DIAGNOSIS — D229 Melanocytic nevi, unspecified: Secondary | ICD-10-CM | POA: Diagnosis not present

## 2018-07-20 DIAGNOSIS — L309 Dermatitis, unspecified: Secondary | ICD-10-CM | POA: Diagnosis not present

## 2018-07-26 ENCOUNTER — Ambulatory Visit: Payer: Self-pay | Admitting: Family Medicine

## 2018-07-26 VITALS — BP 95/75 | HR 75 | Temp 98.3°F | Resp 16 | Wt 173.8 lb

## 2018-07-26 DIAGNOSIS — J060 Acute laryngopharyngitis: Secondary | ICD-10-CM

## 2018-07-26 MED ORDER — AZELASTINE HCL 0.1 % NA SOLN: 2.0000 | Freq: Two times a day (BID) | NASAL | 0 refills | Status: DC

## 2018-07-26 MED ORDER — CETIRIZINE HCL 10 MG PO TABS: 10.0000 mg | ORAL_TABLET | Freq: Every day | ORAL | 0 refills | Status: DC

## 2018-07-26 MED FILL — CETIRIZINE HCL 10 MG TABS: 10 | 30 days supply | Qty: 30 | Fill #0

## 2018-07-26 MED FILL — AZELASTINE HCL 137 MCG SPRY: 0.1 | 30 days supply | Qty: 30 | Fill #0

## 2018-07-26 NOTE — Patient Instructions (Addendum)
PLAN< Recommend treatment to reduce post nasal drip (Prescribed Astelin (nasal spray) and Zyrtec (antihistamins) Recommends increasing hydration, warm tea with honey and lemon, and throat lozenges (cepacol/halls) for their soothing effects Advised to monitor conditions and f/u in 3-5 days if symptoms worsen, persist or are unimproved will reassess and determine if bacterial indications develop.   Pharyngitis Pharyngitis is redness, pain, and swelling (inflammation) of the throat (pharynx). It is a very common cause of sore throat. Pharyngitis can be caused by a bacteria, but it is usually caused by a virus. Most cases of pharyngitis get better on their own without treatment. What are the causes? This condition may be caused by:  Infection by viruses (viral). Viral pharyngitis spreads from person to person (is contagious) through coughing, sneezing, and sharing of personal items or utensils such as cups, forks, spoons, and toothbrushes.  Infection by bacteria (bacterial). Bacterial pharyngitis may be spread by touching the nose or face after coming in contact with the bacteria, or through more intimate contact, such as kissing.  Allergies. Allergies can cause buildup of mucus in the throat (post-nasal drip), leading to inflammation and irritation. Allergies can also cause blocked nasal passages, forcing breathing through the mouth, which dries and irritates the throat.  What increases the risk? You are more likely to develop this condition if:  You are 97-21 years old.  You are exposed to crowded environments such as daycare, school, or dormitory living.  You live in a cold climate.  You have a weakened disease-fighting (immune) system.  What are the signs or symptoms? Symptoms of this condition vary by the cause (viral, bacterial, or allergies) and can include:  Sore throat.  Fatigue.  Low-grade fever.  Headache.  Joint pain and muscle aches.  Skin rashes.  Swollen glands in  the throat (lymph nodes).  Plaque-like film on the throat or tonsils. This is often a symptom of bacterial pharyngitis.  Vomiting.  Stuffy nose (nasal congestion).  Cough.  Red, itchy eyes (conjunctivitis).  Loss of appetite.  How is this diagnosed? This condition is often diagnosed based on your medical history and a physical exam. Your health care provider will ask you questions about your illness and your symptoms. A swab of your throat may be done to check for bacteria (rapid strep test). Other lab tests may also be done, depending on the suspected cause, but these are rare. How is this treated? This condition usually gets better in 3-4 days without medicine. Bacterial pharyngitis may be treated with antibiotic medicines. Follow these instructions at home:  Take over-the-counter and prescription medicines only as told by your health care provider. ? If you were prescribed an antibiotic medicine, take it as told by your health care provider. Do not stop taking the antibiotic even if you start to feel better. ? Do not give children aspirin because of the association with Reye syndrome.  Drink enough water and fluids to keep your urine clear or pale yellow.  Get a lot of rest.  Gargle with a salt-water mixture 3-4 times a day or as needed. To make a salt-water mixture, completely dissolve -1 tsp of salt in 1 cup of warm water.  If your health care provider approves, you may use throat lozenges or sprays to soothe your throat. Contact a health care provider if:  You have large, tender lumps in your neck.  You have a rash.  You cough up green, yellow-brown, or bloody spit. Get help right away if:  Your neck becomes  stiff.  You drool or are unable to swallow liquids.  You cannot drink or take medicines without vomiting.  You have severe pain that does not go away, even after you take medicine.  You have trouble breathing, and it is not caused by a stuffy nose.  You  have new pain and swelling in your joints such as the knees, ankles, wrists, or elbows. Summary  Pharyngitis is redness, pain, and swelling (inflammation) of the throat (pharynx).  While pharyngitis can be caused by a bacteria, the most common causes are viral.  Most cases of pharyngitis get better on their own without treatment.  Bacterial pharyngitis is treated with antibiotic medicines. This information is not intended to replace advice given to you by your health care provider. Make sure you discuss any questions you have with your health care provider. Document Released: 11/30/2005 Document Revised: 01/05/2017 Document Reviewed: 01/05/2017 Elsevier Interactive Patient Education  [employer].

## 2018-07-26 NOTE — Progress Notes (Signed)
Megan Miles is a 57 y.o. female who presents today with concerns of sore throat x 2 days. Of note patient is under care of ENT for parotid mass enlargement that is under surcvelliance care every 6 months- most recent follow up 06/03/18 per record review. She is also is an Art therapist so speaks daily, and frequently for long periods of time. She reports a recent outing with many people from her church and feels that this may be the place that she came in contact with something. She denies any other known work or home sick contacts. She has attempted attempts at treatment limited to self care.    Review of Systems  Constitutional: Negative for chills, fever and malaise/fatigue.  HENT: Positive for congestion and sore throat. Negative for ear discharge, ear pain and sinus pain.   Eyes: Negative.   Respiratory: Negative for cough, sputum production and shortness of breath.   Cardiovascular: Negative.  Negative for chest pain.  Gastrointestinal: Negative for abdominal pain, diarrhea, nausea and vomiting.  Genitourinary: Negative for dysuria, frequency, hematuria and urgency.  Musculoskeletal: Negative for myalgias.  Skin: Negative.   Neurological: Negative for headaches.  Endo/Heme/Allergies: Negative.   Psychiatric/Behavioral: Negative.     O: Vitals:   07/26/18 1014  BP: 95/75  Pulse: 75  Resp: 16  Temp: 98.3 F (36.8 C)  SpO2: 98%     Physical Exam  Constitutional: She is oriented to person, place, and time. Vital signs are normal. She appears well-developed and well-nourished. She is active.  Non-toxic appearance. She does not have a sickly appearance.  HENT:  Head: Normocephalic.  Right Ear: Hearing, tympanic membrane, external ear and ear canal normal.  Left Ear: Hearing, tympanic membrane, external ear and ear canal normal.  Nose: Nose normal.  Mouth/Throat: Uvula is midline and oropharynx is clear and moist.  Neck: Normal range of motion. Neck supple.  Cardiovascular: Normal  rate, regular rhythm, normal heart sounds and normal pulses.  Pulmonary/Chest: Effort normal and breath sounds normal.  Abdominal: Soft. Bowel sounds are normal.  Musculoskeletal: Normal range of motion.  Lymphadenopathy:       Head (right side): No submental and no submandibular adenopathy present.       Head (left side): No submental and no submandibular adenopathy present.    She has no cervical adenopathy.  Neurological: She is alert and oriented to person, place, and time.  Psychiatric: She has a normal mood and affect.  Vitals reviewed.    A: 1. Sore throat and laryngitis    Recommend treatment to reduce post nasal drip (Prescribed Astelin (nasal spray) and Zyrtec (antihistamins) Recommends increasing hydration, warm tea with honey and lemon, and throat lozenges (cepacol/halls) for their soothing effects Advised to monitor conditions and f/u in 3-5 days if symptoms worsen, persist or are unimproved will reassess and determine if bacterial indications develop.   P: Discussed exam findings, diagnosis etiology and medication use and indications reviewed with patient. Follow- Up and discharge instructions provided. No emergent/urgent issues found on exam.  Patient verbalized understanding of information provided and agrees with plan of care (POC), all questions answered.  1. Sore throat and laryngitis Advised to treat potential post nasal drip and nasal inflamation and allergy component. Advised to continue sore throat symptom management and follow up if symptoms worsen or are unresolved.  - azelastine (ASTELIN) 0.1 % nasal spray; Place 2 sprays into both nostrils 2 (two) times daily. Use in each nostril as directed - cetirizine (ZYRTEC) 10 MG tablet;  Take 1 tablet (10 mg total) by mouth daily.

## 2018-10-10 ENCOUNTER — Other Ambulatory Visit: Payer: Self-pay | Admitting: Obstetrics and Gynecology

## 2018-10-26 ENCOUNTER — Other Ambulatory Visit: Payer: Self-pay | Admitting: Obstetrics and Gynecology

## 2018-10-26 DIAGNOSIS — Z1231 Encounter for screening mammogram for malignant neoplasm of breast: Secondary | ICD-10-CM

## 2018-12-06 ENCOUNTER — Ambulatory Visit
Admission: RE | Admit: 2018-12-06 | Discharge: 2018-12-06 | Disposition: A | Discharge status: Home / Self Care | Payer: 59 | Source: Ambulatory Visit | Attending: Obstetrics and Gynecology | Admitting: Obstetrics and Gynecology

## 2018-12-06 DIAGNOSIS — Z1231 Encounter for screening mammogram for malignant neoplasm of breast: Secondary | ICD-10-CM

## 2018-12-08 ENCOUNTER — Other Ambulatory Visit: Payer: Self-pay | Admitting: Obstetrics and Gynecology

## 2018-12-08 DIAGNOSIS — R928 Other abnormal and inconclusive findings on diagnostic imaging of breast: Secondary | ICD-10-CM

## 2018-12-16 ENCOUNTER — Ambulatory Visit
Admission: RE | Admit: 2018-12-16 | Discharge: 2018-12-16 | Disposition: A | Discharge status: Home / Self Care | Payer: 59 | Source: Ambulatory Visit | Attending: Obstetrics and Gynecology | Admitting: Obstetrics and Gynecology

## 2018-12-16 ENCOUNTER — Ambulatory Visit: Payer: Self-pay

## 2018-12-16 DIAGNOSIS — R928 Other abnormal and inconclusive findings on diagnostic imaging of breast: Secondary | ICD-10-CM | POA: Diagnosis not present

## 2019-02-03 MED FILL — TRIAMCINOLONE 0.5% CREAM: 0.5 | 15 days supply | Qty: 30 | Fill #0

## 2019-02-16 MED FILL — SF 5000 PLUS CREAM: 1.1 | 30 days supply | Qty: 51 | Fill #0

## 2019-02-25 ENCOUNTER — Ambulatory Visit: Payer: Self-pay | Admitting: Nurse Practitioner

## 2019-02-25 VITALS — BP 102/70 | HR 82 | Temp 98.3°F | Resp 14 | Wt 175.0 lb

## 2019-02-25 DIAGNOSIS — L709 Acne, unspecified: Secondary | ICD-10-CM

## 2019-02-25 MED ORDER — DOXYCYCLINE HYCLATE 100 MG PO TABS: 100.0000 mg | ORAL_TABLET | Freq: Two times a day (BID) | ORAL | 0 refills | Status: AC

## 2019-02-25 MED FILL — DOXYCYCLINE HYCLATE 100 MG: 100 | 30 days supply | Qty: 60 | Fill #0

## 2019-02-25 NOTE — Patient Instructions (Signed)
Acne -Take medication as prescribed. -Keep the skin clean with a mild soap. -I would recommend using a water-based moisturizer. -Follow up with your PCP for continued management.  Acne is a skin problem that causes pimples and other skin changes. The skin has many tiny openings called pores. Each pore contains an oil gland. Oil glands make an oily substance that is called sebum. Acne occurs when the pores in the skin get blocked. The pores may become infected with bacteria, or they may become red, sore, and swollen. Acne is a common skin problem, especially for teenagers. It often occurs on the face, neck, chest, upper arms, and back. Acne usually goes away over time. What are the causes? Acne is caused when oil glands get blocked with sebum, dead skin cells, and dirt. The bacteria that are normally found in the oil glands then multiply and cause inflammation. Acne is commonly triggered by changes in your hormones. These hormonal changes can cause the oil glands to get bigger and to make more sebum. Factors that can make acne worse include:  Hormone changes during: ? Adolescence. ? Women's menstrual cycles. ? Pregnancy.  Oil-based cosmetics and hair products.  Stress.  Hormone problems that are caused by certain diseases.  Certain medicines.  Pressure from headbands, backpacks, or shoulder pads.  Exposure to certain oils and chemicals.  Eating a diet high in carbohydrates that quickly turn to sugar. These include dairy products, desserts, and chocolates. What increases the risk? This condition is more likely to develop in:  Teenagers.  People who have a family history of acne. What are the signs or symptoms? Symptoms include:  Small, red bumps (pimples or papules).  Whiteheads.  Blackheads.  Small, pus-filled pimples (pustules).  Big, red pimples or pustules that feel tender. More severe acne can cause:  An abscess. This is an infected area that contains a collection  of pus.  Cysts. These are hard, painful, fluid-filled sacs.  Scars. These can happen after large pimples heal. How is this diagnosed? This condition is diagnosed with a medical history and physical exam. Blood tests may also be done. How is this treated? Treatment for this condition can vary depending on the severity of your acne. Treatment may include:  Creams and lotions that prevent oil glands from clogging.  Creams and lotions that treat or prevent infections and inflammation.  Antibiotic medicines that are applied to the skin or taken as a pill.  Pills that decrease sebum production.  Birth control pills.  Light or laser treatments.  Injections of medicine into the affected areas.  Chemicals that cause peeling of the skin.  Surgery. Your health care provider will also recommend the best way to take care of your skin. Good skin care is the most important part of treatment. Follow these instructions at home: Skin care Take care of your skin as told by your health care provider. You may be told to do these things:  Wash your skin gently at least two times each day, as well as: ? After you exercise. ? Before you go to bed.  Use mild soap.  Apply a water-based skin moisturizer after you wash your skin.  Use a sunscreen or sunblock with SPF 30 or greater. This is especially important if you are using acne medicines.  Choose cosmetics that will not block your oil glands (are noncomedogenic). Medicines  Take over-the-counter and prescription medicines only as told by your health care provider.  If you were prescribed an antibiotic medicine, apply  it or take it as told by your health care provider. Do not stop using the antibiotic even if your condition improves. General instructions  Keep your hair clean and off your face. If you have oily hair, shampoo your hair regularly or daily.  Avoid wearing tight headbands or hats.  Avoid picking or squeezing your pimples.  That can make your acne worse and cause scarring.  Shave gently and only when necessary.  Keep a food journal to figure out if any foods are linked to your acne. Avoid dairy products, desserts, and chocolates.  Take steps to manage and reduce stress.  Keep all follow-up visits as told by your health care provider. This is important. Contact a health care provider if:  Your acne is not better after eight weeks.  Your acne gets worse.  You have a large area of skin that is red or tender.  You think that you are having side effects from any acne medicine. Summary  Acne is a skin problem that causes pimples and other skin changes. Acne is a common skin problem, especially for teenagers. Acne usually goes away over time.  Acne is commonly triggered by changes in your hormones. There are many other causes, such as stress, diet, and certain medicines.  Follow your health care provider's instructions for how to take care of your skin. Good skin care is the most important part of treatment.  Take over-the-counter and prescription medicines only as told by your health care provider.  Contact your health care provider if you think that you are having side effects from any acne medicine. This information is not intended to replace advice given to you by your health care provider. Make sure you discuss any questions you have with your health care provider. Document Released: 11/27/2000 Document Revised: 04/12/2018 Document Reviewed: 04/12/2018 Elsevier Interactive Patient Education  2019 [employer].

## 2019-02-25 NOTE — Progress Notes (Signed)
Subjective:     Megan Miles is a 58 y.o. female who presents for evaluation of previously diagnosed acne. Onset was several years ago. Symptoms have been well-controlled over the past several years, but patient's dermatologist has since passed away. Lesions are described as closed comedones and cysts. Acne is primarily located on the face. The patient also reports no periodicity to symptoms except she noticed a return of symptoms when she ran out of her medication. Treatment to date has included doxycycline: effective.  The following portions of the patient's history were reviewed and updated as appropriate: allergies, current medications and past medical history.  Review of Systems Constitutional: negative Ears, nose, mouth, throat, and face: negative Respiratory: negative Cardiovascular: negative Gastrointestinal: negative Integument/breast: positive for skin color change, skin lesion(s) and acne, negative for pruritus and rash    Objective:    BP 102/70   Pulse 82   Temp 98.3 F (36.8 C)   Resp 14   Wt 175 lb (79.4 kg)   LMP 07/14/2012   SpO2 98%   BMI 27.41 kg/m  Physical Exam Vitals signs reviewed.  Constitutional:      Appearance: Normal appearance.  HENT:     Head: Normocephalic.     Right Ear: Tympanic membrane, ear canal and external ear normal.     Left Ear: Tympanic membrane, ear canal and external ear normal.     Nose: Nose normal.     Mouth/Throat:     Mouth: Mucous membranes are moist.  Eyes:     Conjunctiva/sclera: Conjunctivae normal.     Pupils: Pupils are equal, round, and reactive to light.  Neck:     Musculoskeletal: Normal range of motion and neck supple.  Cardiovascular:     Rate and Rhythm: Normal rate and regular rhythm.     Pulses: Normal pulses.     Heart sounds: Normal heart sounds.  Pulmonary:     Effort: Pulmonary effort is normal.     Breath sounds: Normal breath sounds.  Abdominal:     General: Bowel sounds are normal.   Palpations: Abdomen is soft.     Tenderness: There is no abdominal tenderness.  Lymphadenopathy:     Cervical: No cervical adenopathy.  Skin:    General: Skin is warm and dry.     Capillary Refill: Capillary refill takes less than 2 seconds.     Findings: Acne (closed comedones to cheeks) present. No abscess or bruising.     Comments: Multiple dark spots diffusely spread through the cheeks and along the jawlines. Firm cyst-like structures seen to cheeks, no erythema at present  Neurological:     General: No focal deficit present.     Mental Status: She is alert and oriented to person, place, and time.  Psychiatric:        Mood and Affect: Mood normal.        Behavior: Behavior normal.     Assessment:    Acne vulgaris   Plan:   Exam findings, diagnosis etiology and medication use and indications reviewed with patient. Follow- Up and discharge instructions provided. No emergent/urgent issues found on exam. I am going to prescribe the patient a 30-day supply of Doxycycline until she can see her PCP.  The patient's acne has been well-controlled on this medication.  Patient does not exhibit any cellulitis to the structures on her face to include drainage, erythema or warmth.  Patient education was provided. Patient verbalized understanding of information provided and agrees with plan of  care (POC), all questions answered. The patient is advised to call or return to clinic if condition does not see an improvement in symptoms, or to seek the care of the closest emergency department if condition worsens with the above plan.   1. Adult acne  - doxycycline (VIBRA-TABS) 100 MG tablet; Take 1 tablet (100 mg total) by mouth 2 (two) times daily for 30 days.  Dispense: 60 tablet; Refill: 0 -Take medication as prescribed. -Keep the skin clean with a mild soap. -I would recommend using a water-based moisturizer. -Follow up with your PCP for continued management.

## 2019-02-27 ENCOUNTER — Telehealth: Payer: Self-pay

## 2019-02-27 NOTE — Telephone Encounter (Signed)
Patient did not answered the phone 

## 2019-03-10 ENCOUNTER — Encounter: Payer: 59 | Admitting: Family Medicine

## 2019-03-17 ENCOUNTER — Encounter: Payer: Self-pay | Admitting: Family Medicine

## 2019-03-17 ENCOUNTER — Other Ambulatory Visit: Payer: Self-pay

## 2019-03-17 ENCOUNTER — Ambulatory Visit (INDEPENDENT_AMBULATORY_CARE_PROVIDER_SITE_OTHER): Payer: 59 | Admitting: Family Medicine

## 2019-03-17 VITALS — BP 118/74 | HR 66 | Temp 98.7°F | Resp 12 | Ht 67.0 in | Wt 175.0 lb

## 2019-03-17 DIAGNOSIS — Z1159 Encounter for screening for other viral diseases: Secondary | ICD-10-CM | POA: Diagnosis not present

## 2019-03-17 DIAGNOSIS — Z0000 Encounter for general adult medical examination without abnormal findings: Secondary | ICD-10-CM | POA: Diagnosis not present

## 2019-03-17 DIAGNOSIS — Z86010 Personal history of colonic polyps: Secondary | ICD-10-CM | POA: Diagnosis not present

## 2019-03-17 DIAGNOSIS — L709 Acne, unspecified: Secondary | ICD-10-CM

## 2019-03-17 NOTE — Progress Notes (Signed)
Subjective:    Patient ID: Megan Miles, female    DOB: 1961-02-08, 58 y.o.   MRN: [id number]  Patient presents for Annual Exam (Pt fasting) Here for complete physical exam.  I reviewed her last establishing visit and physical from 2019.  She was previously followed by Dr. Garwin [facility] states that she will no longer follow-up there.  Has adult acne she would like to see a new dermatologist.  She initially had problems with this in her 39s until recently.  She is currently on doxycycline 100 mg twice a day given by [employer].  She also uses products from Dr. Towanda Malkin and also gets micro-dermabrasion done every 3 months  Previous history reviewed she was prescribed trazodone for chronic in the Insomnia however this was situational as her mother-in-law recently passed from colon cancer.She never took the medication.  Of note the previous notations stated her husband had colon cancer but this was incorrect.  She states that now she does not have any difficulty sleeping and stress levels are not of concern.  Hyperlipidemia noted in the chart however she was not aware of this.  Has not been on any medications for cholesterol.  Due to be rechecked today.  Works for [employer] in the [employer] [place] history reviewed- has haf sibling besides brother who died from colon cancer    Immunizations he has not had shingles vaccine.  Tetanus and flu shot are up-to-date She is due for hepatitis C screening  Followed by dentist   Review Of Systems:  GEN- denies fatigue, fever, weight loss,weakness, recent illness HEENT- denies eye drainage, change in vision, nasal discharge, CVS- denies chest pain, palpitations RESP- denies SOB, cough, wheeze ABD- denies N/V, change in stools, abd pain GU- denies dysuria, hematuria, dribbling, incontinence MSK- denies joint pain, muscle aches, injury Neuro- denies headache, dizziness, syncope, seizure activity       Objective:    BP 118/74    Pulse 66   Temp 98.7 F (37.1 C) (Oral)   Resp 12   Ht 5\' 7"  (1.702 m)   Wt 175 lb (79.4 kg)   LMP 07/14/2012   SpO2 96%   BMI 27.41 kg/m  GEN- NAD, alert and oriented x3 HEENT- PERRL, EOMI, non injected sclera, pink conjunctiva, MMM, oropharynx clear Neck- Supple, no thyromegaly CVS- RRR, no murmur RESP-CTAB ABD-NABS,soft,NT,ND Skin- hyperpigmented scarring on cheeks, mild acne Psych-normal affect and mood  EXT- No edema Pulses- Radial, DP- 2+        Assessment & Plan:   CPE during time of COVID-19    Problem List Items Addressed This Visit    None    Visit Diagnoses    General medical exam    -  Primary   CPE done, fasting labs obtained, plan for shingles vaccine this summer. Referral to Dermatology for acne   Relevant Orders   CBC with Differential/Platelet (Completed)   Comprehensive metabolic panel (Completed)   Lipid panel (Completed)   History of colon polyps       Followed by GI, scope every 3-years, currently UTD   Adult acne       Relevant Orders   Ambulatory referral to Dermatology   Need for hepatitis C screening test       Relevant Orders   Hepatitis C antibody      Note: This dictation was prepared with Dragon dictation along with smaller phrase technology. Any transcriptional errors that result from this process are unintentional.

## 2019-03-17 NOTE — Patient Instructions (Addendum)
Referral to dermatology  Shingles vaccine can be done this summer  F/u 1 YEAR for physical

## 2019-03-19 ENCOUNTER — Encounter: Payer: Self-pay | Admitting: Family Medicine

## 2019-03-20 ENCOUNTER — Other Ambulatory Visit: Payer: Self-pay | Admitting: *Deleted

## 2019-03-20 DIAGNOSIS — N289 Disorder of kidney and ureter, unspecified: Secondary | ICD-10-CM

## 2019-03-20 LAB — COMPREHENSIVE METABOLIC PANEL
AG Ratio: 1.7 (calc) (ref 1.0–2.5)
ALT: 18 U/L (ref 6–29)
AST: 21 U/L (ref 10–35)
Albumin: 4.4 g/dL (ref 3.6–5.1)
Alkaline phosphatase (APISO): 80 U/L (ref 37–153)
BUN/Creatinine Ratio: 11 (calc) (ref 6–22)
BUN: 12 mg/dL (ref 7–25)
CO2: 23 mmol/L (ref 20–32)
Calcium: 9.5 mg/dL (ref 8.6–10.4)
Chloride: 103 mmol/L (ref 98–110)
Creat: 1.13 mg/dL — ABNORMAL HIGH (ref 0.50–1.05)
Globulin: 2.6 g/dL (calc) (ref 1.9–3.7)
Glucose, Bld: 88 mg/dL (ref 65–99)
Potassium: 4.3 mmol/L (ref 3.5–5.3)
Sodium: 138 mmol/L (ref 135–146)
Total Bilirubin: 0.5 mg/dL (ref 0.2–1.2)
Total Protein: 7 g/dL (ref 6.1–8.1)

## 2019-03-20 LAB — CBC WITH DIFFERENTIAL/PLATELET
Absolute Monocytes: 353 cells/uL (ref 200–950)
Basophils Absolute: 22 cells/uL (ref 0–200)
Basophils Relative: 0.5 %
Eosinophils Absolute: 99 cells/uL (ref 15–500)
Eosinophils Relative: 2.3 %
HCT: 35.2 % (ref 35.0–45.0)
Hemoglobin: 11.5 g/dL — ABNORMAL LOW (ref 11.7–15.5)
Lymphs Abs: 2206 cells/uL (ref 850–3900)
MCH: 28.1 pg (ref 27.0–33.0)
MCHC: 32.7 g/dL (ref 32.0–36.0)
MCV: 86.1 fL (ref 80.0–100.0)
MPV: 9.7 fL (ref 7.5–12.5)
Monocytes Relative: 8.2 %
Neutro Abs: 1621 cells/uL (ref 1500–7800)
Neutrophils Relative %: 37.7 %
Platelets: 259 10*3/uL (ref 140–400)
RBC: 4.09 10*6/uL (ref 3.80–5.10)
RDW: 12.3 % (ref 11.0–15.0)
Total Lymphocyte: 51.3 %
WBC: 4.3 10*3/uL (ref 3.8–10.8)

## 2019-03-20 LAB — LIPID PANEL
Cholesterol: 190 mg/dL (ref <200)
HDL: 52 mg/dL (ref >=50)
LDL Cholesterol (Calc): 113 mg/dL (calc) — ABNORMAL HIGH
Non-HDL Cholesterol (Calc): 138 mg/dL (calc) — ABNORMAL HIGH (ref <130)
Total CHOL/HDL Ratio: 3.7 (calc) (ref <5.0)
Triglycerides: 131 mg/dL (ref <150)

## 2019-03-20 LAB — HEPATITIS C ANTIBODY
Hepatitis C Ab: NONREACTIVE
SIGNAL TO CUT-OFF: 0.01 (ref <1.00)

## 2019-04-25 ENCOUNTER — Encounter: Payer: Self-pay | Admitting: Family Medicine

## 2019-04-25 ENCOUNTER — Other Ambulatory Visit: Payer: Self-pay

## 2019-04-25 ENCOUNTER — Ambulatory Visit (INDEPENDENT_AMBULATORY_CARE_PROVIDER_SITE_OTHER): Payer: 59 | Admitting: Family Medicine

## 2019-04-25 ENCOUNTER — Ambulatory Visit (HOSPITAL_COMMUNITY)
Admission: RE | Admit: 2019-04-25 | Discharge: 2019-04-25 | Disposition: A | Discharge status: Home / Self Care | Payer: 59 | Source: Ambulatory Visit | Attending: Family Medicine | Admitting: Family Medicine

## 2019-04-25 VITALS — BP 118/62 | HR 68 | Temp 97.9°F | Resp 16 | Ht 67.0 in | Wt 176.0 lb

## 2019-04-25 DIAGNOSIS — R3129 Other microscopic hematuria: Secondary | ICD-10-CM

## 2019-04-25 DIAGNOSIS — R1032 Left lower quadrant pain: Secondary | ICD-10-CM

## 2019-04-25 DIAGNOSIS — R1031 Right lower quadrant pain: Secondary | ICD-10-CM | POA: Diagnosis not present

## 2019-04-25 DIAGNOSIS — D219 Benign neoplasm of connective and other soft tissue, unspecified: Secondary | ICD-10-CM

## 2019-04-25 DIAGNOSIS — N83202 Unspecified ovarian cyst, left side: Secondary | ICD-10-CM

## 2019-04-25 DIAGNOSIS — N181 Chronic kidney disease, stage 1: Secondary | ICD-10-CM | POA: Diagnosis not present

## 2019-04-25 LAB — COMPREHENSIVE METABOLIC PANEL
AG Ratio: 1.6 (calc) (ref 1.0–2.5)
ALT: 36 U/L — ABNORMAL HIGH (ref 6–29)
AST: 24 U/L (ref 10–35)
Albumin: 4.7 g/dL (ref 3.6–5.1)
Alkaline phosphatase (APISO): 96 U/L (ref 37–153)
BUN/Creatinine Ratio: 8 (calc) (ref 6–22)
BUN: 9 mg/dL (ref 7–25)
CO2: 28 mmol/L (ref 20–32)
Calcium: 9.9 mg/dL (ref 8.6–10.4)
Chloride: 102 mmol/L (ref 98–110)
Creat: 1.1 mg/dL — ABNORMAL HIGH (ref 0.50–1.05)
Globulin: 3 g/dL (calc) (ref 1.9–3.7)
Glucose, Bld: 100 mg/dL — ABNORMAL HIGH (ref 65–99)
Potassium: 4.9 mmol/L (ref 3.5–5.3)
Sodium: 137 mmol/L (ref 135–146)
Total Bilirubin: 0.6 mg/dL (ref 0.2–1.2)
Total Protein: 7.7 g/dL (ref 6.1–8.1)

## 2019-04-25 LAB — CBC WITH DIFFERENTIAL/PLATELET
Absolute Monocytes: 357 cells/uL (ref 200–950)
Basophils Absolute: 29 cells/uL (ref 0–200)
Basophils Relative: 0.7 %
Eosinophils Absolute: 98 cells/uL (ref 15–500)
Eosinophils Relative: 2.4 %
HCT: 38.9 % (ref 35.0–45.0)
Hemoglobin: 12.7 g/dL (ref 11.7–15.5)
Lymphs Abs: 2042 cells/uL (ref 850–3900)
MCH: 28.4 pg (ref 27.0–33.0)
MCHC: 32.6 g/dL (ref 32.0–36.0)
MCV: 87 fL (ref 80.0–100.0)
MPV: 9.7 fL (ref 7.5–12.5)
Monocytes Relative: 8.7 %
Neutro Abs: 1574 cells/uL (ref 1500–7800)
Neutrophils Relative %: 38.4 %
Platelets: 283 10*3/uL (ref 140–400)
RBC: 4.47 10*6/uL (ref 3.80–5.10)
RDW: 12.5 % (ref 11.0–15.0)
Total Lymphocyte: 49.8 %
WBC: 4.1 10*3/uL (ref 3.8–10.8)

## 2019-04-25 LAB — URINALYSIS, ROUTINE W REFLEX MICROSCOPIC
Bilirubin Urine: NEGATIVE
Glucose, UA: NEGATIVE
Hyaline Cast: NONE SEEN /LPF
Ketones, ur: NEGATIVE
Leukocytes,Ua: NEGATIVE
Nitrite: NEGATIVE
Protein, ur: NEGATIVE
Specific Gravity, Urine: 1.02 (ref 1.001–1.03)
WBC, UA: NONE SEEN /HPF (ref 0–5)
pH: 7 (ref 5.0–8.0)

## 2019-04-25 LAB — MICROSCOPIC MESSAGE

## 2019-04-25 MED ORDER — CEPHALEXIN 500 MG PO CAPS: 500.0000 mg | ORAL_CAPSULE | Freq: Three times a day (TID) | ORAL | 0 refills | Status: DC

## 2019-04-25 NOTE — Patient Instructions (Signed)
Get the stat CT scan We will call with results

## 2019-04-25 NOTE — Progress Notes (Signed)
Subjective:    Patient ID: Megan Miles, female    DOB: [birth year], 58 y.o.   MRN: [id number]  Patient presents for L Sided Pain (x3 days- L sided pain- lower back- no changes to BM or urine- slight nausea)   Patient here with left lower sided abdominal discomfort that also radiates to her left flank area.  This is been constant since Sunday.  She denies any change in her bowel movements no diarrhea no blood in the stool denies any urinary symptoms.  She has had some nausea but this is not changed with eating.  In general due to her pain she has had some decreased appetite.  She did take Tylenol last night which help a little.  She has not had any vomiting no fever no URI symptoms.  She has had another episode like this a year ago she was in the emergency room CT scan showed fibroid uterus but otherwise nothing acute.  Advised possible shingles coming on but she never had any type of eruption and it resolves on its own.  Denies any abnormal vaginal bleeding denies any vaginal discharge  Ovarian cyst ruputured   Omega 3 fish lipo drops / probiotiocs vitam,in 166mcg   Review Of Systems:  GEN- denies fatigue, fever, weight loss,weakness, recent illness HEENT- denies eye drainage, change in vision, nasal discharge, CVS- denies chest pain, palpitations RESP- denies SOB, cough, wheeze ABD- denies N/V, change in stools, +abd pain GU- denies dysuria, hematuria, dribbling, incontinence MSK- denies joint pain, muscle aches, injury Neuro- denies headache, dizziness, syncope, seizure activity       Objective:    BP 118/62   Pulse 68   Temp 97.9 F (36.6 C) (Oral)   Resp 16   Ht 5\' 7"  (1.702 m)   Wt 176 lb (79.8 kg)   LMP 07/14/2012   SpO2 98%   BMI 27.57 kg/m  GEN- NAD, alert and oriented x3, appears in pain HEENT- PERRL, EOMI, non injected sclera, pink conjunctiva, MMM, oropharynx clear Neck- Supple, no LAD CVS- RRR, no murmur RESP-CTAB ABD-NABS,soft, TTP left flank, TTP LLQ,  +reboung, no guarding EXT- No edema Pulses- Radial, DP- 2+        Assessment & Plan:      Problem List Items Addressed This Visit      Unprioritized   Chronic kidney disease (CKD), stage I    Other Visit Diagnoses    LLQ pain    -  Primary   No acute abdominal pathology seen on scan, no kidney stone or diveritculitis, concern for ovarian cyst versus exophytic fibroid.  Will get stat pelvic ultrasound in the morning.  She does have microscopic hematuria as well with the abdominal pain questionable GYN pathology will go ahead and start Keflex while await urine culture.  I offered her pain medication but she declines at this time she does have some over-the-counter pain aids that she can use.  She has very minimal what appears to be chronic kidney disease but creatinine is only at 1.1 I think we can just watch this at this time.  Was able to tolerate a salad at the time I spoke with her with her CT scan results without being nauseous.   Relevant Orders   CT Abdomen Pelvis Wo Contrast (Completed)   Urinalysis, Routine w reflex microscopic (Completed)   Urine Culture   CBC with Differential/Platelet (Completed)   Comprehensive metabolic panel (Completed)   US Pelvis Complete   US Transvaginal Non-OB  Cyst of left ovary       Relevant Orders   US Pelvis Complete   US Transvaginal Non-OB   Fibroids       Relevant Orders   US Pelvis Complete   US Transvaginal Non-OB   Microscopic hematuria          Note: This dictation was prepared with Dragon dictation along with smaller phrase technology. Any transcriptional errors that result from this process are unintentional.

## 2019-04-26 ENCOUNTER — Ambulatory Visit (HOSPITAL_COMMUNITY): Payer: 59

## 2019-04-26 ENCOUNTER — Telehealth: Payer: Self-pay | Admitting: Family Medicine

## 2019-04-26 MED ORDER — HYDROCODONE-ACETAMINOPHEN 5-325 MG PO TABS: 1.0000 | ORAL_TABLET | Freq: Four times a day (QID) | ORAL | 0 refills | Status: DC | PRN

## 2019-04-26 NOTE — Telephone Encounter (Signed)
Spoke with patient and informed her that Norco was called in. Patient verbalized understanding.

## 2019-04-26 NOTE — Telephone Encounter (Signed)
Patient called in and stated that she was offered pain medications on yesterday but declined she would now like to have pain medications called in for abdominal pain

## 2019-04-26 NOTE — Telephone Encounter (Signed)
Norco called in she can take 1/2 to 1 full tablet as needed for pain

## 2019-04-27 ENCOUNTER — Telehealth: Payer: Self-pay | Admitting: Family Medicine

## 2019-04-27 LAB — URINE CULTURE
MICRO NUMBER:: 466313
SPECIMEN QUALITY:: ADEQUATE

## 2019-04-27 NOTE — Telephone Encounter (Signed)
Patient called in today stating that she is currently unable to keep any solid foods down. I advised her that her PCP is out of the office today but I could notify Dr.Pickard. Patient stated that she only wanted to discuss this with Dr. Buelah Manis and that she was ok with Dr. Buelah Manis not responding until tomorrow.

## 2019-04-28 ENCOUNTER — Other Ambulatory Visit: Payer: Self-pay | Admitting: Family Medicine

## 2019-04-28 ENCOUNTER — Other Ambulatory Visit: Payer: Self-pay

## 2019-04-28 ENCOUNTER — Ambulatory Visit (HOSPITAL_COMMUNITY)
Admission: RE | Admit: 2019-04-28 | Discharge: 2019-04-28 | Disposition: A | Discharge status: Home / Self Care | Payer: 59 | Source: Ambulatory Visit | Attending: Family Medicine | Admitting: Family Medicine

## 2019-04-28 DIAGNOSIS — D219 Benign neoplasm of connective and other soft tissue, unspecified: Secondary | ICD-10-CM

## 2019-04-28 DIAGNOSIS — N83202 Unspecified ovarian cyst, left side: Secondary | ICD-10-CM | POA: Diagnosis present

## 2019-04-28 DIAGNOSIS — D259 Leiomyoma of uterus, unspecified: Secondary | ICD-10-CM | POA: Diagnosis not present

## 2019-04-28 DIAGNOSIS — R1032 Left lower quadrant pain: Secondary | ICD-10-CM

## 2019-04-28 NOTE — Telephone Encounter (Signed)
Spoke with patient and she stated that she is able to tolerate liquids and soups. Advised her that if she begins to not be able to tolerate liquids and solid foods she will need to report to the ER. Patient verbalized understanding.

## 2019-04-28 NOTE — Telephone Encounter (Signed)
Call pt and see if she is tolerating liquids at [facility], she is scheduled for [place] today at 12:30 I believe If she is too sick and not tolerating fluids with ongoing pain she has to go to the ER They can give her IV fluids, repeat labs, rescan her and [place] there

## 2019-05-18 DIAGNOSIS — D251 Intramural leiomyoma of uterus: Secondary | ICD-10-CM | POA: Diagnosis not present

## 2019-05-18 DIAGNOSIS — D252 Subserosal leiomyoma of uterus: Secondary | ICD-10-CM | POA: Diagnosis not present

## 2019-05-19 ENCOUNTER — Telehealth: Payer: Self-pay | Admitting: Family Medicine

## 2019-05-19 NOTE — Telephone Encounter (Signed)
Referral changed to Dr. Ronita Hipps per patient's request.

## 2019-05-19 NOTE — Telephone Encounter (Signed)
Patient calling to get a referral to dr Edwyna Ready obgyn if possible  [phone]

## 2019-06-05 ENCOUNTER — Encounter: Payer: 59 | Admitting: Adult Health

## 2019-06-09 ENCOUNTER — Telehealth: Payer: Self-pay | Admitting: Gastroenterology

## 2019-06-09 ENCOUNTER — Telehealth: Payer: Self-pay | Admitting: Family Medicine

## 2019-06-09 DIAGNOSIS — D259 Leiomyoma of uterus, unspecified: Secondary | ICD-10-CM | POA: Diagnosis not present

## 2019-06-09 DIAGNOSIS — R109 Unspecified abdominal pain: Secondary | ICD-10-CM | POA: Diagnosis not present

## 2019-06-09 DIAGNOSIS — Z6828 Body mass index (BMI) 28.0-28.9, adult: Secondary | ICD-10-CM | POA: Diagnosis not present

## 2019-06-09 NOTE — Telephone Encounter (Signed)
Pt already has appt for  7/31 with GI, for bloating If they need it due to her insurance okay to put in

## 2019-06-09 NOTE — Telephone Encounter (Signed)
Patient called in stating that her stomach is very bloated. She is also having a pain on the left lower side of abdominal that goes around to back that is very painful. She is requesting to been seen in offc by dr.jacobs asap.

## 2019-06-09 NOTE — Telephone Encounter (Signed)
Patient called in requesting a referral to [facility] for abdominal pain that we've seen her for. She has an appointment today with GYN but states that she feels it may be and abdominal infection also causing pain. Please advise?

## 2019-06-09 NOTE — Telephone Encounter (Signed)
The pt has abd pain and left side pain that radiates to the back. She saw PCP and was advised to get a follow up with our office.  They believe she has fibroids and will see her GYN today.  She says the bloating is better but would still like to see GI.  Appt made for 7/31.  She will call if her pain worsens.

## 2019-06-09 NOTE — Telephone Encounter (Signed)
No referral is needed per patient's insurance

## 2019-06-22 ENCOUNTER — Other Ambulatory Visit: Payer: Self-pay | Admitting: Obstetrics and Gynecology

## 2019-06-22 DIAGNOSIS — D259 Leiomyoma of uterus, unspecified: Secondary | ICD-10-CM

## 2019-07-07 ENCOUNTER — Telehealth: Payer: Self-pay | Admitting: Family Medicine

## 2019-07-07 DIAGNOSIS — K219 Gastro-esophageal reflux disease without esophagitis: Secondary | ICD-10-CM

## 2019-07-07 NOTE — Telephone Encounter (Signed)
Patient called and left voicemail requesting a referral to [facility] for Acid reflux. May I place referral?

## 2019-07-07 NOTE — Telephone Encounter (Signed)
Referral place, and patient notified

## 2019-07-07 NOTE — Telephone Encounter (Signed)
Okay to refer? 

## 2019-07-13 ENCOUNTER — Telehealth: Payer: Self-pay | Admitting: Family Medicine

## 2019-07-13 ENCOUNTER — Ambulatory Visit
Admission: RE | Admit: 2019-07-13 | Discharge: 2019-07-13 | Disposition: A | Discharge status: Home / Self Care | Payer: 59 | Source: Ambulatory Visit | Attending: Obstetrics and Gynecology | Admitting: Obstetrics and Gynecology

## 2019-07-13 DIAGNOSIS — D252 Subserosal leiomyoma of uterus: Secondary | ICD-10-CM | POA: Diagnosis not present

## 2019-07-13 DIAGNOSIS — D259 Leiomyoma of uterus, unspecified: Secondary | ICD-10-CM

## 2019-07-13 MED ORDER — GADOBENATE DIMEGLUMINE 529 MG/ML IV SOLN
16.0000 mL | Freq: Once | INTRAVENOUS | Status: AC | PRN
  Administered 2019-07-13: 16 mL via INTRAVENOUS

## 2019-07-13 NOTE — Telephone Encounter (Signed)
Pt would like to have shingrix sent to [facility]

## 2019-07-14 ENCOUNTER — Ambulatory Visit: Payer: 59 | Admitting: Gastroenterology

## 2019-07-14 MED ORDER — SHINGRIX 50 MCG/0.5ML IM SUSR: 0.5000 mL | Freq: Once | INTRAMUSCULAR | 1 refills | Status: AC

## 2019-07-14 NOTE — Telephone Encounter (Signed)
Prescription sent to pharmacy.

## 2019-07-18 ENCOUNTER — Other Ambulatory Visit: Payer: Self-pay | Admitting: Obstetrics and Gynecology

## 2019-07-25 ENCOUNTER — Other Ambulatory Visit: Payer: Self-pay

## 2019-07-25 DIAGNOSIS — Z01818 Encounter for other preprocedural examination: Secondary | ICD-10-CM | POA: Diagnosis not present

## 2019-07-25 DIAGNOSIS — R102 Pelvic and perineal pain: Secondary | ICD-10-CM | POA: Diagnosis not present

## 2019-07-25 DIAGNOSIS — D259 Leiomyoma of uterus, unspecified: Secondary | ICD-10-CM | POA: Diagnosis not present

## 2019-07-25 DIAGNOSIS — N858 Other specified noninflammatory disorders of uterus: Secondary | ICD-10-CM | POA: Diagnosis not present

## 2019-07-31 NOTE — H&P (Signed)
Megan Miles is a 58 y.o. female  P: 0-0-1-0 presenting for hysterectomy because of pelvic pain and symptomatic uterine fibroids.  In May of this year, the patient began to experience what she describes as a swollen abdomen accompanied by severe  left sided pelvic pain. She was seen at the [facility] where a  CT scan was performed that was normal except for uterine fibroids with  an exophytic uterine fibroid vs left adnexal mass measuring 5.6 cm. A follow up ultrasound revealed: a uterus measuring: 9.3 x 5.2 x 6.1 cm, endometrium: 3.6 mm with #4 fibroids:  fundal intramural-6.1 cm, pedunculated fundal-3.4 cm, left anterior intramural-1.4 cm and posterior pedunculated-6.2 cm and right ovary-1.9 cm and left ovary-3.0 cm.  Following this visit she only achieved relief with Hydrocodone and at night had to add sleep aids.  She is unable to sleep on her left side, admits to urinary frequency but denies any urgency, incontinence, changes in bowel function, vaginitis symptoms or dyspareunia. An endometrial biopsy done at her preoperative visit returned: atrophic endometrium with breakdown; no hyperplasia, atypia or malignancy:  A review of medical management options were given to the patient however, she has chosen to proceed with definitive therapy in the form of hysterectomy.  Past Medical History  OB History: G: 1;  P:0-0-1-0   GYN History: menarche: 58 YO;   LMP: menopausal;   Denies history of abnormal PAP smear.   Last PAP smear-2019-normal  Medical History: Anemia, Vitamin D Deficiency, Colon Polyps, Hyperlipidemia, Acne and TMJ  Surgical History:1990 Left Foot Toe Surgery and 2008 Liposuction Denies problems with anesthesia or history of blood transfusions  Family History: Colon Cancer, Hypertension and Diabetes Mellitus  Social History: Married and employed by [employer]; Denies tobacco or alcohol   Medication: None  Allergies  Allergen Reactions  . Shellfish  Allergy Itching, Swelling and Rash    ROS: Admits to reading  glasses tinnitus due to TMJ, occasional nausea and bloating but  denies headache, vision changes, nasal congestion, dysphagia, dizziness, hoarseness, cough,  chest pain, shortness of breath, vomiting, diarrhea,constipation,  urinary frequency, urgency  dysuria, hematuria, vaginitis symptoms,  swelling of joints,easy bruising,  myalgias, arthralgias, skin rashes, unexplained weight loss and except as is mentioned in the history of present illness, patient's review of systems is otherwise negative.    Physical Exam  Bp: 110/60   R: 16  Weight: 171 lbs. Height: 5'6" BMI: 27.6     Neck: supple without masses or thyromegaly Lungs: clear to auscultation Heart: regular rate and rhythm Abdomen: soft, non-tender and no organomegaly Pelvic:EGBUS- wnl; vagina-normal rugae; uterus-12-14 weeks  size, cervix without lesions or motion tenderness; adnexae-no tenderness or masses Extremities:  no clubbing, cyanosis or edema   Assesment: Pelvic Pain                      Symptomatic Uterine Fibroids   Disposition:  The patient was given the indications for her procedure(s) along with the risks, which include but are not limited to, reaction to anesthesia, damage to adjacent organs, Infection, excessive bleeding, formation of scar tissue, early menopause, pelvic prolapse and the possible need for an open abdominal incision. She was further advised that she will experience transient post operative facial edema, that her hospital stay is expected to be 0-2 days, she should be able to return to her usual activities within 2-3 weeks (except intercourse to be delayed until 6 weeks) and that the robotic approach to  her surgery requires more time to perform than an open abdominal approach. Patient was given the Miralax bowel prep to be completed  the day before her procedure. She verbalized understanding of these risks and pre-operative instructions and has  consented to proceed with a Robot Assisted Total Laparoscopic Hysterectomy with Bilateral Salpingectomy at [facility] on August 23, 2019.     CSN# [id number]   Abelino Tippin J. Florene Glen, PA-C  for Dr.Sandra A. Rivard

## 2019-08-08 ENCOUNTER — Encounter (HOSPITAL_COMMUNITY): Payer: Self-pay

## 2019-08-08 NOTE — Progress Notes (Signed)
PCP - [facility] Cardiologist - none  Chest x-ray -  EKG -  Stress Test -  ECHO -  Cardiac Cath -   Sleep Study -  CPAP -   Fasting Blood Sugar -  Checks Blood Sugar _____ times a day  Blood Thinner Instructions: Aspirin Instructions:  Anesthesia review:   Patient denies shortness of breath, fever, cough and chest pain at PAT appointment   .  asymptomatic   Patient verbalized understanding of instructions that were given to them at the PAT appointment. Patient was also instructed that they will need to review over the PAT instructions again at home before surgery.

## 2019-08-08 NOTE — Progress Notes (Deleted)
PCP - [facility] Cardiologist -   Chest x-ray -  EKG -  Stress Test -  ECHO -  Cardiac Cath -   Sleep Study -  CPAP -   Fasting Blood Sugar -  Checks Blood Sugar _____ times a day  Blood Thinner Instructions: Aspirin Instructions:  Anesthesia review:   Patient denies shortness of breath, fever, cough and chest pain at PAT appointment   Patient verbalized understanding of instructions that were given to them at the PAT appointment. Patient was also instructed that they will need to review over the PAT instructions again at home before surgery.

## 2019-08-08 NOTE — Patient Instructions (Addendum)
Treniece Holsclaw     covid test   [place] , [place]  08/08/2019      Your procedure is scheduled on    08-16-19   Report to [place]  at            [place].M.   Call this number if you have problems the morning of surgery:[phone]   OUR ADDRESS IS [place], WE ARE LOCATED IN THE [facility]ICAL [facility] WITH [facility].   Remember :  NO SOLID FOOD AFTER MIDNIGHT THE NIGHT PRIOR TO SURGERY. NOTHING BY MOUTH EXCEPT CLEAR LIQUIDS UNTIL      0530 am then nothing by mouth . PLEASE FINISH ENSURE DRINK PER SURGEON ORDER  WHICH NEEDS TO BE COMPLETED AT    0530 am then nothing by mouth .     CLEAR LIQUID DIET   Foods Allowed                                                                     Foods Excluded  Coffee and tea, regular and decaf                             liquids that you cannot  Plain Jell-O any favor except red or purple                                           see through such as: Fruit ices (not with fruit pulp)                                     milk, soups, orange juice  Iced Popsicles                                    All solid food Carbonated beverages, regular and diet                                    Cranberry, grape and apple juices Sports drinks like Gatorade Lightly seasoned clear broth or consume(fat free) Sugar, honey syrup   _____________________________________________________________________    Take these medicines the morning of surgery with A SIP OF WATER       none   Do not wear jewelry, make-up or nail polish.  Do not wear lotions, powders, or perfumes, or deoderant.  Do not shave 48 hours prior to surgery.    Do not bring valuables to the hospital.  [facility] is not responsible for any belongings or valuables.  Contacts, dentures or bridgework may not be worn into surgery.  Leave your suitcase in the car.  After surgery it may be brought to your room.  For patients admitted to the hospital,  discharge time will be determined by your treatment team.  Patients discharged the day of surgery will  not be allowed to drive home.   Special instructions:    Please read over the following fact sheets that you were given:       ______             [facility] - Preparing for Surgery Before surgery, you can play an important role.  Because skin is not sterile, your skin needs to be as free of germs as possible.  You can reduce the number of germs on your skin by washing with CHG (chlorahexidine gluconate) soap before surgery.  CHG is an antiseptic cleaner which kills germs and bonds with the skin to continue killing germs even after washing. Please DO NOT use if you have an allergy to CHG or antibacterial soaps.  If your skin becomes reddened/irritated stop using the CHG and inform your nurse when you arrive at Short Stay. Do not shave (including legs and underarms) for at least 48 hours prior to the first CHG shower.  You may shave your face/neck. Please follow these instructions carefully:  1.  Shower with CHG Soap the night before surgery and the  morning of Surgery.  2.  If you choose to wash your hair, wash your hair first as usual with your  normal  shampoo.  3.  After you shampoo, rinse your hair and body thoroughly to remove the  shampoo.                           4.  Use CHG as you would any other liquid soap.  You can apply chg directly  to the skin and wash                       Gently with a scrungie or clean washcloth.  5.  Apply the CHG Soap to your body ONLY FROM THE NECK DOWN.   Do not use on face/ open                           Wound or open sores. Avoid contact with eyes, ears mouth and genitals (private parts).                       Wash face,  Genitals (private parts) with your normal soap.             6.  Wash thoroughly, paying special attention to the area where your surgery  will be performed.  7.  Thoroughly rinse your body with warm water from the neck  down.  8.  DO NOT shower/wash with your normal soap after using and rinsing off  the CHG Soap.                9.  Pat yourself dry with a clean towel.            10.  Wear clean pajamas.            11.  Place clean sheets on your bed the night of your first shower and do not  sleep with pets. Day of Surgery : Do not apply any lotions/deodorants the morning of surgery.  Please wear clean clothes to the hospital/surgery center.  FAILURE TO FOLLOW THESE INSTRUCTIONS MAY RESULT IN THE CANCELLATION OF YOUR SURGERY PATIENT SIGNATURE_________________________________  NURSE SIGNATURE__________________________________  ________________________________________________________________________  WHAT IS A BLOOD TRANSFUSION? Blood Transfusion Information  A transfusion is the replacement  of blood or some of its parts. Blood is made up of multiple cells which provide different functions.  Red blood cells carry oxygen and are used for blood loss replacement.  White blood cells fight against infection.  Platelets control bleeding.  Plasma helps clot blood.  Other blood products are available for specialized needs, such as hemophilia or other clotting disorders. BEFORE THE TRANSFUSION  Who gives blood for transfusions?   Healthy volunteers who are fully evaluated to make sure their blood is safe. This is blood bank blood. Transfusion therapy is the safest it has ever been in the practice of medicine. Before blood is taken from a donor, a complete history is taken to make sure that person has no history of diseases nor engages in risky social behavior (examples are intravenous drug use or sexual activity with multiple partners). The donor's travel history is screened to minimize risk of transmitting infections, such as malaria. The donated blood is tested for signs of infectious diseases, such as HIV and hepatitis. The blood is then tested to be sure it is compatible with you in order to minimize the  chance of a transfusion reaction. If you or a relative donates blood, this is often done in anticipation of surgery and is not appropriate for emergency situations. It takes many days to process the donated blood. RISKS AND COMPLICATIONS Although transfusion therapy is very safe and saves many lives, the main dangers of transfusion include:   Getting an infectious disease.  Developing a transfusion reaction. This is an allergic reaction to something in the blood you were given. Every precaution is taken to prevent this. The decision to have a blood transfusion has been considered carefully by your caregiver before blood is given. Blood is not given unless the benefits outweigh the risks. AFTER THE TRANSFUSION  Right after receiving a blood transfusion, you will usually feel much better and more energetic. This is especially true if your red blood cells have gotten low (anemic). The transfusion raises the level of the red blood cells which carry oxygen, and this usually causes an energy increase.  The nurse administering the transfusion will monitor you carefully for complications. HOME CARE INSTRUCTIONS  No special instructions are needed after a transfusion. You may find your energy is better. Speak with your caregiver about any limitations on activity for underlying diseases you may have. SEEK MEDICAL CARE IF:   Your condition is not improving after your transfusion.  You develop redness or irritation at the intravenous (IV) site. SEEK IMMEDIATE MEDICAL CARE IF:  Any of the following symptoms occur over the next 12 hours:  Shaking chills.  You have a temperature by mouth above 102 F (38.9 C), not controlled by medicine.  Chest, back, or muscle pain.  People around you feel you are not acting correctly or are confused.  Shortness of breath or difficulty breathing.  Dizziness and fainting.  You get a rash or develop hives.  You have a decrease in urine output.  Your urine  turns a dark color or changes to pink, red, or brown. Any of the following symptoms occur over the next 10 days:  You have a temperature by mouth above 102 F (38.9 C), not controlled by medicine.  Shortness of breath.  Weakness after normal activity.  The white part of the eye turns yellow (jaundice).  You have a decrease in the amount of urine or are urinating less often.  Your urine turns a dark color or changes to pink,  red, or brown. Document Released: 11/27/2000 Document Revised: 02/22/2012 Document Reviewed: 07/16/2008 [facility] Patient Information 2014 [place], [place].  _______________________________________________________________________

## 2019-08-09 ENCOUNTER — Encounter: Payer: Self-pay | Admitting: Gastroenterology

## 2019-08-09 ENCOUNTER — Other Ambulatory Visit: Payer: Self-pay

## 2019-08-09 ENCOUNTER — Ambulatory Visit (INDEPENDENT_AMBULATORY_CARE_PROVIDER_SITE_OTHER): Payer: 59 | Admitting: Gastroenterology

## 2019-08-09 ENCOUNTER — Encounter (HOSPITAL_COMMUNITY): Payer: Self-pay

## 2019-08-09 ENCOUNTER — Encounter (HOSPITAL_COMMUNITY)
Admission: RE | Admit: 2019-08-09 | Discharge: 2019-08-09 | Disposition: A | Discharge status: Home / Self Care | Payer: 59 | Source: Ambulatory Visit | Attending: Obstetrics and Gynecology | Admitting: Obstetrics and Gynecology

## 2019-08-09 VITALS — BP 110/60 | HR 77 | Temp 97.8°F | Ht 67.0 in | Wt 173.0 lb

## 2019-08-09 DIAGNOSIS — R1032 Left lower quadrant pain: Secondary | ICD-10-CM

## 2019-08-09 DIAGNOSIS — Z20828 Contact with and (suspected) exposure to other viral communicable diseases: Secondary | ICD-10-CM | POA: Diagnosis not present

## 2019-08-09 DIAGNOSIS — R140 Abdominal distension (gaseous): Secondary | ICD-10-CM

## 2019-08-09 DIAGNOSIS — K219 Gastro-esophageal reflux disease without esophagitis: Secondary | ICD-10-CM

## 2019-08-09 DIAGNOSIS — Z01812 Encounter for preprocedural laboratory examination: Secondary | ICD-10-CM | POA: Diagnosis not present

## 2019-08-09 HISTORY — DX: Gastro-esophageal reflux disease without esophagitis: K21.9

## 2019-08-09 LAB — CBC
HCT: 38.3 % (ref 36.0–46.0)
Hemoglobin: 11.7 g/dL — ABNORMAL LOW (ref 12.0–15.0)
MCH: 28.3 pg (ref 26.0–34.0)
MCHC: 30.5 g/dL (ref 30.0–36.0)
MCV: 92.7 fL (ref 80.0–100.0)
Platelets: 269 10*3/uL (ref 150–400)
RBC: 4.13 MIL/uL (ref 3.87–5.11)
RDW: 12.5 % (ref 11.5–15.5)
WBC: 4.3 10*3/uL (ref 4.0–10.5)
nRBC: 0 % (ref 0.0–0.2)

## 2019-08-09 LAB — BASIC METABOLIC PANEL
Anion gap: 8 (ref 5–15)
BUN: 12 mg/dL (ref 6–20)
CO2: 26 mmol/L (ref 22–32)
Calcium: 9.3 mg/dL (ref 8.9–10.3)
Chloride: 104 mmol/L (ref 98–111)
Creatinine, Ser: 1.17 mg/dL — ABNORMAL HIGH (ref 0.44–1.00)
GFR calc Af Amer: 60 mL/min — ABNORMAL LOW (ref >60)
GFR calc non Af Amer: 52 mL/min — ABNORMAL LOW (ref >60)
Glucose, Bld: 90 mg/dL (ref 70–99)
Potassium: 3.6 mmol/L (ref 3.5–5.1)
Sodium: 138 mmol/L (ref 135–145)

## 2019-08-09 LAB — ABO/RH: ABO/RH(D): O POS

## 2019-08-09 MED ORDER — ENSURE PRE-SURGERY PO LIQD
296.0000 mL | Freq: Once | ORAL | Status: DC
  Filled 2019-08-09: qty 296

## 2019-08-09 MED ORDER — PANTOPRAZOLE SODIUM 40 MG PO TBEC: 40.0000 mg | DELAYED_RELEASE_TABLET | Freq: Every day | ORAL | 5 refills | Status: DC

## 2019-08-09 MED FILL — PANTOPRAZOLE SOD DR 40 MG T: 40 | 90 days supply | Qty: 90 | Fill #0

## 2019-08-09 NOTE — Progress Notes (Signed)
08/09/2019 Megan Miles [id number] Feb 24, 1961   HISTORY OF PRESENT ILLNESS: This is a 58 year old female who is a patient of Dr. Ardis Hughs.  She is here today at the request of her PCP to discuss recent issues with reflux, however, the majority of her visit she focuses on complaints of left lower quadrant abdominal pain and bloating.  These symptoms have been present since at least March 2019.  The left lower abdominal pain is constant.  She has now had 2 CT scans of the abdomen pelvis performed since March 2019.  A one in March 2019 was with contrast.  The one in May of this year was without contrast.  Regardless, neither of those have shown any other cause of her symptoms.  The only thing found is unusual appearance of the uterus and left adnexa, likely uterine fibroid changes.  Following CT scan this year she then also underwent ultrasound of the pelvis and MRI of the pelvis, which all once again confirmed ongoing GYN issues with multiple uterine fibroids including a 6.6 cm exophytic fibroid M the right pelvic cul-de-sac and a 3.3 cm exophytic fibroid in the left adnexa.  She is scheduled for hysterectomy next week.    She had a colonoscopy in November 2018 at which time she had 2 small polyps removed that were tubular adenomas on pathology and internal hemorrhoids.  Just of note, she had some poor attitude throughout her visit stating several things such as "I know my body", "the fibroids never bothered me before", and "I am done talking" specifically meaning done talking to other physicians about her symptoms.  She asks several specific questions as to if she has "diverticulitis" or "peritonitis".  Mispronounces medical terms despite acting like she has medical background.  At first she really did not seem to want to hear what I had to say, but by the end of her visit I think that I was able to put her mind at ease and reassure her.  She then proceeds to talk about her reflux.  She says that  sometimes the indigestion is really bad she has to sleep with her head up.  It sometimes causes a tightening sensation in her chest.  She complains of constant nausea._She is on no acid reflux type medications.  She reports that she does have a history of reflux issues in the past.  No dysphagia.   Past Medical History:  Diagnosis Date  . Anemia    low iron  border line as a child  . GERD (gastroesophageal reflux disease)    hx of no meds now  . Hyperlipidemia 06/21/2017  . Ovarian cyst   . Vitamin D deficiency 06/21/2017   Past Surgical History:  Procedure Laterality Date  . COLONOSCOPY    . ESOPHAGOGASTRODUODENOSCOPY    . LIPOSUCTION  '90s  . TOE SURGERY  '90s    reports that she has never smoked. She has never used smokeless tobacco. She reports previous alcohol use. She reports that she does not use drugs. family history includes Cancer in her brother; Colon cancer in her brother; Diabetes in her mother; Hypertension in her mother, sister, sister, and sister. Allergies  Allergen Reactions  . Shellfish Allergy Itching, Swelling and Rash      Outpatient Encounter Medications as of 08/09/2019  Medication Sig  . BLACK CURRANT SEED OIL PO Apply 1 application topically daily.   . Cholecalciferol (VITAMIN D) 125 MCG (5000 UT) CAPS Take 5,000 Units by mouth daily.   Marland Kitchen  Multiple Vitamin (MULTIVITAMIN) tablet Take 1 tablet by mouth daily.  . Omega-3 Fatty Acids (OMEGA-3 FISH OIL PO) Take 1 capsule by mouth daily.   . Probiotic Product (PROBIOTIC DAILY PO) Take 1 capsule by mouth daily.  . [DISCONTINUED] acidophilus (RISAQUAD) CAPS capsule Take 1 capsule by mouth daily.  . [DISCONTINUED] azelastine (ASTELIN) 0.1 % nasal spray Place 2 sprays into both nostrils 2 (two) times daily. Use in each nostril as directed (Patient not taking: Reported on 08/04/2019)  . [DISCONTINUED] cephALEXin (KEFLEX) 500 MG capsule Take 1 capsule (500 mg total) by mouth 3 (three) times daily. (Patient not taking:  Reported on 08/04/2019)  . [DISCONTINUED] cetirizine (ZYRTEC) 10 MG tablet Take 1 tablet (10 mg total) by mouth daily. (Patient not taking: Reported on 08/04/2019)  . [DISCONTINUED] HYDROcodone-acetaminophen (NORCO) 5-325 MG tablet Take 1 tablet by mouth every 6 (six) hours as needed for moderate pain. (Patient not taking: Reported on 08/04/2019)   Facility-Administered Encounter Medications as of 08/09/2019  Medication  . [START ON 08/10/2019] feeding supplement (ENSURE PRE-SURGERY) liquid 296 mL     REVIEW OF SYSTEMS  : All other systems reviewed and negative except where noted in the History of Present Illness.   PHYSICAL EXAM: BP 110/60   Pulse 77   Temp 97.8 F (36.6 C)   Ht 5\' 7"  (1.702 m)   Wt 173 lb (78.5 kg)   LMP 07/14/2012   BMI 27.10 kg/m  General: Well developed black female in no acute distress Head: Normocephalic and atraumatic Eyes:  Sclerae anicteric, conjunctiva pink. Ears: Normal auditory acuity Lungs: Clear throughout to auscultation; no increased WOB. Heart: Regular rate and rhythm; no M/R/G. Abdomen: Soft, non-distended.  BS present.  Mild LLQ TTP. Musculoskeletal: Symmetrical with no gross deformities  Skin: No lesions on visible extremities Extremities: No edema  Neurological: Alert oriented x 4, grossly non-focal Psychological:  Alert and cooperative. Normal mood and affect  ASSESSMENT AND PLAN: *LLQ abdominal pain and bloating: After long conversation I think that I finally convinced her that her left lower quadrant abdominal pain and bloating sensation are very likely secondary to her uterine fibroids/gynecologic source.  She is scheduled for hysterectomy next week.  She will see how things are following her surgery and we can always reassess later if needed.  She has had multiple other imaging studies and they have all been negative for any other source of symptoms. *GERD:  Will start her on pantoprazole 40 mg daily for now and see how she does.   **25  minutes were spent with the patient at least 50% of which was spent in discussing of her symptoms, evaluation, and treatment options.  CC:  [place], Modena Nunnery, MD

## 2019-08-09 NOTE — Patient Instructions (Signed)
We have sent the following medications to your pharmacy for you to pick up at your convenience: Pantoprazole 40 mg daily

## 2019-08-10 DIAGNOSIS — Z01812 Encounter for preprocedural laboratory examination: Secondary | ICD-10-CM | POA: Diagnosis not present

## 2019-08-10 DIAGNOSIS — Z20828 Contact with and (suspected) exposure to other viral communicable diseases: Secondary | ICD-10-CM | POA: Diagnosis not present

## 2019-08-10 NOTE — Progress Notes (Signed)
I agree with the above note, plan 

## 2019-08-12 ENCOUNTER — Other Ambulatory Visit (HOSPITAL_COMMUNITY)
Admission: RE | Admit: 2019-08-12 | Discharge: 2019-08-12 | Disposition: A | Discharge status: Home / Self Care | Payer: 59 | Source: Ambulatory Visit | Attending: Obstetrics and Gynecology | Admitting: Obstetrics and Gynecology

## 2019-08-12 DIAGNOSIS — Z20828 Contact with and (suspected) exposure to other viral communicable diseases: Secondary | ICD-10-CM | POA: Diagnosis not present

## 2019-08-12 DIAGNOSIS — Z01812 Encounter for preprocedural laboratory examination: Secondary | ICD-10-CM | POA: Diagnosis not present

## 2019-08-13 LAB — SARS CORONAVIRUS 2 (TAT 6-24 HRS): SARS Coronavirus 2: NEGATIVE

## 2019-08-16 ENCOUNTER — Encounter (HOSPITAL_BASED_OUTPATIENT_CLINIC_OR_DEPARTMENT_OTHER): Payer: Self-pay

## 2019-08-16 ENCOUNTER — Ambulatory Visit (HOSPITAL_BASED_OUTPATIENT_CLINIC_OR_DEPARTMENT_OTHER): Payer: 59 | Admitting: Anesthesiology

## 2019-08-16 ENCOUNTER — Ambulatory Visit (HOSPITAL_BASED_OUTPATIENT_CLINIC_OR_DEPARTMENT_OTHER)
Admission: RE | Admit: 2019-08-16 | Discharge: 2019-08-16 | Disposition: A | Discharge status: Home / Self Care | Payer: 59 | Attending: Obstetrics and Gynecology | Admitting: Obstetrics and Gynecology

## 2019-08-16 ENCOUNTER — Ambulatory Visit (HOSPITAL_BASED_OUTPATIENT_CLINIC_OR_DEPARTMENT_OTHER): Payer: 59 | Admitting: Physician Assistant

## 2019-08-16 ENCOUNTER — Encounter (HOSPITAL_BASED_OUTPATIENT_CLINIC_OR_DEPARTMENT_OTHER)
Admission: RE | Disposition: A | Discharge status: Home / Self Care | Payer: Self-pay | Source: Home / Self Care | Attending: Obstetrics and Gynecology

## 2019-08-16 DIAGNOSIS — D251 Intramural leiomyoma of uterus: Secondary | ICD-10-CM | POA: Insufficient documentation

## 2019-08-16 DIAGNOSIS — Z8719 Personal history of other diseases of the digestive system: Secondary | ICD-10-CM | POA: Diagnosis not present

## 2019-08-16 DIAGNOSIS — N72 Inflammatory disease of cervix uteri: Secondary | ICD-10-CM | POA: Insufficient documentation

## 2019-08-16 DIAGNOSIS — E785 Hyperlipidemia, unspecified: Secondary | ICD-10-CM | POA: Insufficient documentation

## 2019-08-16 DIAGNOSIS — N800 Endometriosis of uterus: Secondary | ICD-10-CM | POA: Insufficient documentation

## 2019-08-16 DIAGNOSIS — K219 Gastro-esophageal reflux disease without esophagitis: Secondary | ICD-10-CM | POA: Diagnosis not present

## 2019-08-16 DIAGNOSIS — N83291 Other ovarian cyst, right side: Secondary | ICD-10-CM | POA: Diagnosis not present

## 2019-08-16 DIAGNOSIS — M25559 Pain in unspecified hip: Secondary | ICD-10-CM

## 2019-08-16 DIAGNOSIS — D259 Leiomyoma of uterus, unspecified: Secondary | ICD-10-CM | POA: Diagnosis not present

## 2019-08-16 DIAGNOSIS — N181 Chronic kidney disease, stage 1: Secondary | ICD-10-CM | POA: Insufficient documentation

## 2019-08-16 DIAGNOSIS — E559 Vitamin D deficiency, unspecified: Secondary | ICD-10-CM | POA: Diagnosis not present

## 2019-08-16 DIAGNOSIS — Z79899 Other long term (current) drug therapy: Secondary | ICD-10-CM | POA: Insufficient documentation

## 2019-08-16 DIAGNOSIS — G8929 Other chronic pain: Secondary | ICD-10-CM | POA: Diagnosis present

## 2019-08-16 DIAGNOSIS — N83292 Other ovarian cyst, left side: Secondary | ICD-10-CM | POA: Insufficient documentation

## 2019-08-16 DIAGNOSIS — D252 Subserosal leiomyoma of uterus: Secondary | ICD-10-CM

## 2019-08-16 HISTORY — PX: ROBOTIC ASSISTED LAPAROSCOPIC HYSTERECTOMY AND SALPINGECTOMY: SHX6379

## 2019-08-16 LAB — TYPE AND SCREEN
ABO/RH(D): O POS
Antibody Screen: NEGATIVE

## 2019-08-16 LAB — POCT PREGNANCY, URINE: Preg Test, Ur: NEGATIVE

## 2019-08-16 SURGERY — XI ROBOTIC ASSISTED LAPAROSCOPIC HYSTERECTOMY AND SALPINGECTOMY
Anesthesia: General | Site: Abdomen | Laterality: Bilateral

## 2019-08-16 MED ORDER — LIDOCAINE 2% (20 MG/ML) 5 ML SYRINGE
INTRAMUSCULAR | Status: AC
  Filled 2019-08-16: qty 5

## 2019-08-16 MED ORDER — LACTATED RINGERS IV SOLN
INTRAVENOUS | Status: DC
  Administered 2019-08-16 (×3): via INTRAVENOUS
  Filled 2019-08-16: qty 1000

## 2019-08-16 MED ORDER — GABAPENTIN 300 MG PO CAPS
ORAL_CAPSULE | ORAL | Status: AC
  Filled 2019-08-16: qty 1

## 2019-08-16 MED ORDER — CELECOXIB 400 MG PO CAPS
400.0000 mg | ORAL_CAPSULE | ORAL | Status: AC
  Administered 2019-08-16: 400 mg via ORAL
  Filled 2019-08-16: qty 1

## 2019-08-16 MED ORDER — LACTATED RINGERS IV SOLN
INTRAVENOUS | Status: DC
  Filled 2019-08-16: qty 1000

## 2019-08-16 MED ORDER — PHENYLEPHRINE 40 MCG/ML (10ML) SYRINGE FOR IV PUSH (FOR BLOOD PRESSURE SUPPORT)
PREFILLED_SYRINGE | INTRAVENOUS | Status: AC
  Filled 2019-08-16: qty 10

## 2019-08-16 MED ORDER — MIDAZOLAM HCL 2 MG/2ML IJ SOLN
INTRAMUSCULAR | Status: AC
  Filled 2019-08-16: qty 2

## 2019-08-16 MED ORDER — OXYCODONE-ACETAMINOPHEN 5-325 MG PO TABS
1.0000 | ORAL_TABLET | ORAL | Status: DC | PRN
  Filled 2019-08-16: qty 2

## 2019-08-16 MED ORDER — OXYCODONE-ACETAMINOPHEN 5-325 MG PO TABS: ORAL_TABLET | ORAL | 0 refills | Status: DC

## 2019-08-16 MED ORDER — ARTIFICIAL TEARS OPHTHALMIC OINT
TOPICAL_OINTMENT | OPHTHALMIC | Status: AC
  Filled 2019-08-16: qty 3.5

## 2019-08-16 MED ORDER — ROCURONIUM BROMIDE 10 MG/ML (PF) SYRINGE
PREFILLED_SYRINGE | INTRAVENOUS | Status: AC
  Filled 2019-08-16: qty 10

## 2019-08-16 MED ORDER — IBUPROFEN 600 MG PO TABS: ORAL_TABLET | ORAL | 1 refills | Status: DC

## 2019-08-16 MED ORDER — FENTANYL CITRATE (PF) 250 MCG/5ML IJ SOLN
INTRAMUSCULAR | Status: AC
  Filled 2019-08-16: qty 5

## 2019-08-16 MED ORDER — LABETALOL HCL 5 MG/ML IV SOLN
INTRAVENOUS | Status: AC
  Filled 2019-08-16: qty 4

## 2019-08-16 MED ORDER — PROMETHAZINE HCL 25 MG/ML IJ SOLN
6.2500 mg | INTRAMUSCULAR | Status: DC | PRN
  Filled 2019-08-16: qty 1

## 2019-08-16 MED ORDER — PROPOFOL 10 MG/ML IV BOLUS
INTRAVENOUS | Status: DC | PRN
  Administered 2019-08-16: 130 mg via INTRAVENOUS

## 2019-08-16 MED ORDER — ROCURONIUM BROMIDE 50 MG/5ML IV SOSY
PREFILLED_SYRINGE | INTRAVENOUS | Status: DC | PRN
  Administered 2019-08-16: 60 mg via INTRAVENOUS
  Administered 2019-08-16 (×2): 20 mg via INTRAVENOUS

## 2019-08-16 MED ORDER — FENTANYL CITRATE (PF) 100 MCG/2ML IJ SOLN
INTRAMUSCULAR | Status: DC | PRN
  Administered 2019-08-16 (×2): 50 ug via INTRAVENOUS
  Administered 2019-08-16: 100 ug via INTRAVENOUS

## 2019-08-16 MED ORDER — ONDANSETRON HCL 4 MG/2ML IJ SOLN
INTRAMUSCULAR | Status: AC
  Filled 2019-08-16: qty 2

## 2019-08-16 MED ORDER — DEXAMETHASONE SODIUM PHOSPHATE 10 MG/ML IJ SOLN
INTRAMUSCULAR | Status: DC | PRN
  Administered 2019-08-16: 10 mg via INTRAVENOUS

## 2019-08-16 MED ORDER — LIDOCAINE 2% (20 MG/ML) 5 ML SYRINGE
INTRAMUSCULAR | Status: DC | PRN
  Administered 2019-08-16: 60 mg via INTRAVENOUS

## 2019-08-16 MED ORDER — SCOPOLAMINE 1 MG/3DAYS TD PT72
MEDICATED_PATCH | TRANSDERMAL | Status: AC
  Filled 2019-08-16: qty 1

## 2019-08-16 MED ORDER — DEXAMETHASONE SODIUM PHOSPHATE 4 MG/ML IJ SOLN
4.0000 mg | INTRAMUSCULAR | Status: DC
  Filled 2019-08-16: qty 1

## 2019-08-16 MED ORDER — ONDANSETRON HCL 4 MG PO TABS
4.0000 mg | ORAL_TABLET | Freq: Four times a day (QID) | ORAL | Status: DC | PRN
  Filled 2019-08-16: qty 1

## 2019-08-16 MED ORDER — SCOPOLAMINE 1 MG/3DAYS TD PT72
1.0000 | MEDICATED_PATCH | TRANSDERMAL | Status: DC
  Administered 2019-08-16: 08:00:00 1.5 mg via TRANSDERMAL
  Filled 2019-08-16: qty 1

## 2019-08-16 MED ORDER — CELECOXIB 200 MG PO CAPS
ORAL_CAPSULE | ORAL | Status: AC
  Filled 2019-08-16: qty 2

## 2019-08-16 MED ORDER — SUGAMMADEX SODIUM 500 MG/5ML IV SOLN
INTRAVENOUS | Status: DC | PRN
  Administered 2019-08-16: 1200 mg via INTRAVENOUS

## 2019-08-16 MED ORDER — PROPOFOL 10 MG/ML IV BOLUS
INTRAVENOUS | Status: AC
  Filled 2019-08-16: qty 40

## 2019-08-16 MED ORDER — MENTHOL 3 MG MT LOZG
1.0000 | LOZENGE | OROMUCOSAL | Status: DC | PRN
  Filled 2019-08-16: qty 9

## 2019-08-16 MED ORDER — ACETAMINOPHEN 500 MG PO TABS
ORAL_TABLET | ORAL | Status: AC
  Filled 2019-08-16: qty 2

## 2019-08-16 MED ORDER — HYDROMORPHONE HCL 1 MG/ML IJ SOLN
0.2500 mg | INTRAMUSCULAR | Status: DC | PRN
  Administered 2019-08-16 (×2): 0.5 mg via INTRAVENOUS
  Filled 2019-08-16: qty 0.5

## 2019-08-16 MED ORDER — OXYCODONE-ACETAMINOPHEN 5-325 MG PO TABS: 1.0000 | ORAL_TABLET | ORAL | 0 refills | Status: DC | PRN

## 2019-08-16 MED ORDER — MIDAZOLAM HCL 5 MG/5ML IJ SOLN
INTRAMUSCULAR | Status: DC | PRN
  Administered 2019-08-16: 2 mg via INTRAVENOUS

## 2019-08-16 MED ORDER — KETAMINE HCL 10 MG/ML IJ SOLN
INTRAMUSCULAR | Status: AC
  Filled 2019-08-16: qty 1

## 2019-08-16 MED ORDER — DEXAMETHASONE SODIUM PHOSPHATE 10 MG/ML IJ SOLN
INTRAMUSCULAR | Status: AC
  Filled 2019-08-16: qty 1

## 2019-08-16 MED ORDER — OXYCODONE HCL 5 MG/5ML PO SOLN
5.0000 mg | Freq: Once | ORAL | Status: DC | PRN
  Filled 2019-08-16: qty 5

## 2019-08-16 MED ORDER — DOCUSATE SODIUM 100 MG PO CAPS
100.0000 mg | ORAL_CAPSULE | Freq: Two times a day (BID) | ORAL | Status: DC
  Filled 2019-08-16: qty 1

## 2019-08-16 MED ORDER — HYDROMORPHONE HCL 1 MG/ML IJ SOLN
INTRAMUSCULAR | Status: AC
  Filled 2019-08-16: qty 1

## 2019-08-16 MED ORDER — OXYCODONE HCL 5 MG PO TABS
5.0000 mg | ORAL_TABLET | Freq: Once | ORAL | Status: DC | PRN
  Filled 2019-08-16: qty 1

## 2019-08-16 MED ORDER — KETAMINE HCL 10 MG/ML IJ SOLN
INTRAMUSCULAR | Status: DC | PRN
  Administered 2019-08-16: 30 mg via INTRAVENOUS

## 2019-08-16 MED ORDER — LIDOCAINE 2% (20 MG/ML) 5 ML SYRINGE
INTRAMUSCULAR | Status: DC | PRN
  Administered 2019-08-16: 1.5 mg/kg/h via INTRAVENOUS

## 2019-08-16 MED ORDER — SODIUM CHLORIDE 0.9 % IV SOLN
2.0000 g | INTRAVENOUS | Status: AC
  Administered 2019-08-16: 08:00:00 2 g via INTRAVENOUS
  Filled 2019-08-16 (×2): qty 2

## 2019-08-16 MED ORDER — GABAPENTIN 300 MG PO CAPS
300.0000 mg | ORAL_CAPSULE | ORAL | Status: AC
  Administered 2019-08-16: 300 mg via ORAL
  Filled 2019-08-16: qty 1

## 2019-08-16 MED ORDER — ACETAMINOPHEN 500 MG PO TABS
1000.0000 mg | ORAL_TABLET | ORAL | Status: AC
  Administered 2019-08-16: 1000 mg via ORAL
  Filled 2019-08-16: qty 2

## 2019-08-16 MED ORDER — ONDANSETRON HCL 4 MG/2ML IJ SOLN
4.0000 mg | Freq: Four times a day (QID) | INTRAMUSCULAR | Status: DC | PRN
  Filled 2019-08-16: qty 2

## 2019-08-16 MED ORDER — PHENYLEPHRINE 40 MCG/ML (10ML) SYRINGE FOR IV PUSH (FOR BLOOD PRESSURE SUPPORT)
PREFILLED_SYRINGE | INTRAVENOUS | Status: DC | PRN
  Administered 2019-08-16 (×4): 80 ug via INTRAVENOUS

## 2019-08-16 MED ORDER — SODIUM CHLORIDE (PF) 0.9 % IJ SOLN
INTRAMUSCULAR | Status: AC
  Filled 2019-08-16: qty 10

## 2019-08-16 MED FILL — IBUPROFEN 600 MG TABLET: 600 | 7 days supply | Qty: 30 | Fill #0

## 2019-08-16 MED FILL — OXYCODONE-ACETAMINOPHEN 5-3: 5-325 | 5 days supply | Qty: 20 | Fill #0

## 2019-08-16 SURGICAL SUPPLY — 68 items
BARRIER ADHS 3X4 INTERCEED (GAUZE/BANDAGES/DRESSINGS) ×2 IMPLANT
BLADE LAP MORCELLATOR 15X9.5 (ELECTROSURGICAL) IMPLANT
BLADE MORCELLATOR EXT  12.5X15 (ELECTROSURGICAL)
BLADE MORCELLATOR EXT 12.5X15 (ELECTROSURGICAL) IMPLANT
CANISTER SUCT 3000ML PPV (MISCELLANEOUS) ×2 IMPLANT
COVER BACK TABLE 60X90IN (DRAPES) ×2 IMPLANT
COVER TIP SHEARS 8 DVNC (MISCELLANEOUS) ×1 IMPLANT
COVER TIP SHEARS 8MM DA VINCI (MISCELLANEOUS) ×1
COVER WAND RF STERILE (DRAPES) ×2 IMPLANT
DECANTER SPIKE VIAL GLASS SM (MISCELLANEOUS) ×4 IMPLANT
DEFOGGER SCOPE WARMER CLEARIFY (MISCELLANEOUS) ×2 IMPLANT
DERMABOND ADVANCED (GAUZE/BANDAGES/DRESSINGS) ×1
DERMABOND ADVANCED .7 DNX12 (GAUZE/BANDAGES/DRESSINGS) ×1 IMPLANT
DILATOR CANAL MILEX (MISCELLANEOUS) ×2 IMPLANT
DRAPE ARM DVNC X/XI (DISPOSABLE) ×4 IMPLANT
DRAPE COLUMN DVNC XI (DISPOSABLE) ×1 IMPLANT
DRAPE DA VINCI XI ARM (DISPOSABLE) ×4
DRAPE DA VINCI XI COLUMN (DISPOSABLE) ×1
DRAPE SHEET LG 3/4 BI-LAMINATE (DRAPES) ×2 IMPLANT
DURAPREP 26ML APPLICATOR (WOUND CARE) ×2 IMPLANT
ELECT REM PT RETURN 9FT ADLT (ELECTROSURGICAL) ×2
ELECTRODE REM PT RTRN 9FT ADLT (ELECTROSURGICAL) ×1 IMPLANT
GAUZE 4X4 16PLY RFD (DISPOSABLE) ×2 IMPLANT
GAUZE VASELINE 3X9 (GAUZE/BANDAGES/DRESSINGS) ×2 IMPLANT
GLOVE BIO SURGEON STRL SZ7 (GLOVE) ×2 IMPLANT
GLOVE BIOGEL PI IND STRL 7.0 (GLOVE) ×5 IMPLANT
GLOVE BIOGEL PI INDICATOR 7.0 (GLOVE) ×5
GLOVE ECLIPSE 6.5 STRL STRAW (GLOVE) ×6 IMPLANT
IRRIG SUCT STRYKERFLOW 2 WTIP (MISCELLANEOUS) ×2
IRRIGATION SUCT STRKRFLW 2 WTP (MISCELLANEOUS) ×1 IMPLANT
IV NS IRRIG 3000ML ARTHROMATIC (IV SOLUTION) ×2 IMPLANT
IV SET EXTENSION GRAVITY 40 LF (IV SETS) ×2 IMPLANT
IV SOD CHL 0.9% 1000ML (IV SOLUTION) ×2 IMPLANT
LEGGING LITHOTOMY PAIR STRL (DRAPES) ×2 IMPLANT
OBTURATOR OPTICAL STANDARD 8MM (TROCAR) ×1
OBTURATOR OPTICAL STND 8 DVNC (TROCAR) ×1
OBTURATOR OPTICALSTD 8 DVNC (TROCAR) ×1 IMPLANT
OCCLUDER COLPOPNEUMO (BALLOONS) ×2 IMPLANT
PACK ROBOT WH (CUSTOM PROCEDURE TRAY) ×2 IMPLANT
PACK ROBOTIC GOWN (GOWN DISPOSABLE) ×2 IMPLANT
PACK TRENDGUARD 450 HYBRID PRO (MISCELLANEOUS) ×1 IMPLANT
PAD PREP 24X48 CUFFED NSTRL (MISCELLANEOUS) ×2 IMPLANT
POUCH LAPAROSCOPIC INSTRUMENT (MISCELLANEOUS) ×2 IMPLANT
PROTECTOR NERVE ULNAR (MISCELLANEOUS) ×6 IMPLANT
SEAL CANN UNIV 5-8 DVNC XI (MISCELLANEOUS) ×4 IMPLANT
SEAL XI 5MM-8MM UNIVERSAL (MISCELLANEOUS) ×4
SEALER VESSEL DA VINCI XI (MISCELLANEOUS) ×1
SEALER VESSEL EXT DVNC XI (MISCELLANEOUS) ×1 IMPLANT
SET TRI-LUMEN FLTR TB AIRSEAL (TUBING) ×2 IMPLANT
STRIP CLOSURE SKIN 1/2X4 (GAUZE/BANDAGES/DRESSINGS) ×2 IMPLANT
STRIP CLOSURE SKIN 1/4X3 (GAUZE/BANDAGES/DRESSINGS) ×2 IMPLANT
SUT MNCRL AB 3-0 PS2 27 (SUTURE) ×4 IMPLANT
SUT VIC AB 0 CT1 27 (SUTURE) ×2
SUT VIC AB 0 CT1 27XBRD ANBCTR (SUTURE) ×2 IMPLANT
SUT VICRYL 0 UR6 27IN ABS (SUTURE) ×2 IMPLANT
SUT VLOC 180 0 9IN  GS21 (SUTURE) ×2
SUT VLOC 180 0 9IN GS21 (SUTURE) ×2 IMPLANT
TIP RUMI ORANGE 6.7MMX12CM (TIP) IMPLANT
TIP UTERINE 5.1X6CM LAV DISP (MISCELLANEOUS) IMPLANT
TIP UTERINE 6.7X10CM GRN DISP (MISCELLANEOUS) ×2 IMPLANT
TIP UTERINE 6.7X6CM WHT DISP (MISCELLANEOUS) IMPLANT
TIP UTERINE 6.7X8CM BLUE DISP (MISCELLANEOUS) IMPLANT
TOWEL OR 17X26 10 PK STRL BLUE (TOWEL DISPOSABLE) ×2 IMPLANT
TRAY FOLEY W/BAG SLVR 16FR (SET/KITS/TRAYS/PACK) ×1
TRAY FOLEY W/BAG SLVR 16FR ST (SET/KITS/TRAYS/PACK) ×1 IMPLANT
TRENDGUARD 450 HYBRID PRO PACK (MISCELLANEOUS) ×2
TROCAR PORT AIRSEAL 8X120 (TROCAR) ×2 IMPLANT
WATER STERILE IRR 1000ML POUR (IV SOLUTION) ×2 IMPLANT

## 2019-08-16 NOTE — Interval H&P Note (Signed)
History and Physical Interval Note:  08/16/2019 8:30 AM  Megan Miles  has presented today for surgery, with the diagnosis of Abdominal Pain, Uterine Fibroids.  The various methods of treatment have been discussed with the patient and family. After consideration of risks, benefits and other options for treatment, the patient has consented to  Procedure(s): XI ROBOTIC Brent as a surgical intervention.  The patient's history has been reviewed, patient examined, no change in status, stable for surgery.  I have reviewed the patient's chart and labs.  Questions were answered to the patient's satisfaction.     Katharine Look A Auna Mikkelsen

## 2019-08-16 NOTE — Anesthesia Preprocedure Evaluation (Signed)
Anesthesia Evaluation  Patient identified by MRN, date of birth, ID band Patient awake    Reviewed: Allergy & Precautions, NPO status , Patient's Chart, lab work & pertinent test results  Airway Mallampati: II  TM Distance: >3 FB Neck ROM: Full    Dental no notable dental hx.    Pulmonary neg pulmonary ROS,    Pulmonary exam normal breath sounds clear to auscultation       Cardiovascular negative cardio ROS Normal cardiovascular exam Rhythm:Regular Rate:Normal     Neuro/Psych negative neurological ROS  negative psych ROS   GI/Hepatic Neg liver ROS, GERD  ,  Endo/Other  negative endocrine ROS  Renal/GU Renal InsufficiencyRenal disease  negative genitourinary   Musculoskeletal negative musculoskeletal ROS (+)   Abdominal   Peds negative pediatric ROS (+)  Hematology negative hematology ROS (+) anemia ,   Anesthesia Other Findings   Reproductive/Obstetrics negative OB ROS                             Anesthesia Physical Anesthesia Plan  ASA: II  Anesthesia Plan: General   Post-op Pain Management:    Induction: Intravenous  PONV Risk Score and Plan: 3 and Ondansetron, Dexamethasone, Midazolam and Treatment may vary due to age or medical condition  Airway Management Planned: Oral ETT  Additional Equipment:   Intra-op Plan:   Post-operative Plan: Extubation in OR  Informed Consent: I have reviewed the patients History and Physical, chart, labs and discussed the procedure including the risks, benefits and alternatives for the proposed anesthesia with the patient or authorized representative who has indicated his/her understanding and acceptance.     Dental advisory given  Plan Discussed with: CRNA  Anesthesia Plan Comments:         Anesthesia Quick Evaluation

## 2019-08-16 NOTE — Op Note (Signed)
Preoperative diagnosis: Symptomatic uterine fibroids  Postoperative diagnosis: Same   Anesthesia: General  Anesthesiologist: Dr. Sabra Heck  Procedure: Robotically assisted total hysterectomy with bilateral salpingo-oophorectomy  Surgeon: Dr. Katharine Look Jameriah Trotti  Assistant: Earnstine Regal P.A.-C.  Estimated blood loss: 100 cc  Procedure:  After being informed of the planned procedure with possible complications including but not limited to bleeding, infection, injury to other organs, need for laparotomy, possible need for morcellation with risks and benefits reviewed, expected hospital stay and recovery, informed consent is obtained and patient is taken to OR #5. She is placed in  lithotomy position on Treng[place] with both arms padded and tucked on each side and bilateral knee-high sequential compressive devices. She is given general anesthesia with endotracheal intubation without any complication. She is prepped and draped in a sterile fashion. A three-way Foley catheter is inserted in her bladder.  Pelvic exam reveals: 14 week size irregular uterus with a right dominant posterior fibroid, adnexa are normal  A weighted speculum is inserted in the vagina and the anterior lip of the cervix is grasped with a tenaculum forcep. We proceed with a paracervical block and vaginal infiltration using ropivacaine 0.5% diluted 1 in 1 with saline. The uterus was then sounded at 8 cm. We easily dilate the cervix using Hegar dilator to  #27 which allows for easy placement of the intrauterine RUMI manipulator with a 3.0 KOH ring and a vaginal occluder. The ring is sutured to the cervix with 0 Vicryl.  Trocar placement is decided. We infiltrate at the umbilicus with 10 cc of ropivacaine per protocol and perform a 10 mm semi-elliptical incision which is brought down bluntly to the fascia. The fascia is identified and grasped with Coker forceps. The fascia is incised with Mayo scissors. Peritoneum is entered bluntly. A  pursestring suture of 0 Vicryl is placed on the fascia and a 10 mm Hassan trocar is easily inserted in the abdominal cavity held in placed with a Purstring suture. This allows for easy insufflation of a pneumoperitoneum using warmed CO2 at a maximum pressure of 15 mm of mercury. 60 cc of Ropivacaine 0.5 % diluted 1 in 1 is sent in the pelvis and the patient is positioned in reverse Trendelenburg. We then placed two 57mm robotic trocar on the left, one 54mm robotic trocar on the right and one 10 mm patient's side assistant trocar on the right  after infiltrating every site  with ropivacaine per protocol. The robot is docked on the right of the patient after positioning her in Trendelenburg. A monopolar scissor is inserted in arm #4, a Long bipolar forceps is inserted in arm #2 and a Vessel Seal  is inserted in arm #1.  Preparation and docking is completed in 60 minutes.  Observation: uterus is enlarged with multiple fibroids, the largest is right posterior measuring 7 cm. Both tubes and ovaries are normal. Appendix and liver are normal. Gallbladder is not seen.  We start on the right side by sealing and sectioning the round ligament . We then seal the right  infundibulo-pelvic ligament after identifying the full course of the ureter. This gives [place] entry into the retroperitoneal space with an easy dissection of the anterior broad ligament. This was opened all the way to the left round ligament. We proceed the same way on the left side.   We can now perform  systematic dissection of the bladder down from  the anterior vaginal cuff which is easily identified with the KOH ring. The plane of dissection is  confirmed after filling the bladder with 200 cc of saline. We are able to dissect the bladder 2 cm below the KOH ring. We then opened the posterior right and left  broad ligament all the way to the posterior KOH ring after identifying the full course of the ureters.   Uterine vessels are now sealed and sectioned  at the level of the Koh ring bilaterally.  The vaginal occluder is inflated and we proceed with a 360 colpotomy using an open monopolar scissors and freeing the uterus entirely with both tubes and ovaries.  The uterus is delivered vaginally with traction only. The vaginal occluder is reinserted in the vagina to maintain pneumoperitoneum.  Instruments are then modified for a suture cut in arm #4 and a long tip forcep in arm #2. We proceed with closure of the vaginal cuff with 2 running sutures of 0 V-Lock. We irrigated profusely with warm saline and confirm a satisfactory hemostasis as well as 2 normal ureters with good mobility and no dilatation. Interceed is placed on the vaginal vault.  All instruments are then removed and the robot is undocked. Console time: 1 hours and 37 minutes.  All trochars are removed under direct visualization after evacuating the pneumoperitoneum.  The fascia of the supraumbilical incision and the assistant trocar are closed with the previously placed pursestring suture of 0 Vicryl and a figure-of-eight suture of 0-Vicryl. All incisions are then closed with subcuticular suture of 3-0 Monocryl and Dermabond.  A speculum is inserted in the vagina to confirm a adequate closure of the vaginal cuff and good hemostasis.  Instrument and sponge count is complete x2. Estimated blood loss is 100 cc. The procedure is well tolerated by the patient is taken to recovery room in a well and stable condition.  Specimen: Uterus and tubes weighing 212 g sent to pathology

## 2019-08-16 NOTE — Anesthesia Postprocedure Evaluation (Signed)
Anesthesia Post Note  Patient: Megan Miles  Procedure(s) Performed: XI ROBOTIC ASSISTED LAPAROSCOPIC HYSTERECTOMY WITH BILATERAL SALPINGECTOMY AND OOPHERECTOMY (Bilateral Abdomen)     Patient location during evaluation: PACU Anesthesia Type: General Level of consciousness: awake and alert Pain management: pain level controlled Vital Signs Assessment: post-procedure vital signs reviewed and stable Respiratory status: spontaneous breathing, nonlabored ventilation and respiratory function stable Cardiovascular status: blood pressure returned to baseline and stable Postop Assessment: no apparent nausea or vomiting Anesthetic complications: no    Last Vitals:  Vitals:   08/16/19 1312 08/16/19 1315  BP:  103/81  Pulse: 67 65  Resp: 19 18  Temp:    SpO2: 100% 96%    Last Pain:  Vitals:   08/16/19 1312  TempSrc:   PainSc: 3                  Lynda Rainwater

## 2019-08-16 NOTE — Anesthesia Procedure Notes (Addendum)
Procedure Name: Intubation Date/Time: 08/16/2019 8:42 AM Performed by: Bonney Aid, CRNA Pre-anesthesia Checklist: Patient identified, Emergency Drugs available, Suction available and Patient being monitored Patient Re-evaluated:Patient Re-evaluated prior to induction Oxygen Delivery Method: Circle system utilized Preoxygenation: Pre-oxygenation with 100% oxygen Induction Type: IV induction Ventilation: Mask ventilation without difficulty Laryngoscope Size: Mac and 3 Grade View: Grade II Tube type: Oral Number of attempts: 1 Airway Equipment and Method: Stylet Placement Confirmation: ETT inserted through vocal cords under direct vision,  positive ETCO2 and breath sounds checked- equal and bilateral Secured at: 21 cm Tube secured with: Tape Dental Injury: Teeth and Oropharynx as per pre-operative assessment

## 2019-08-16 NOTE — Discharge Instructions (Signed)
Total Laparoscopic Hysterectomy, Care After °This sheet gives you information about how to care for yourself after your procedure. Your health care provider may also give you more specific instructions. If you have problems or questions, contact your health care provider. °What can I expect after the procedure? °After the procedure, it is common to have: °· Pain and bruising around your incisions. °· A sore throat, if a breathing tube was used during surgery. °· Fatigue. °· Poor appetite. °· Less interest in sex. °If your ovaries were also removed, it is also common to have symptoms of menopause such as hot flashes, night sweats, and lack of sleep (insomnia). °Follow these instructions at home: °Bathing °· Do not take baths, swim, or use a hot tub until your health care provider approves. You may need to only take showers for 2-3 weeks. °· Keep your bandage (dressing) dry until your health care provider says it can be removed. °Incision care ° °· Follow instructions from your health care provider about how to take care of your incisions. Make sure you: °? Wash your hands with soap and water before you change your dressing. If soap and water are not available, use hand sanitizer. °? Change your dressing as told by your health care provider. °? Leave stitches (sutures), skin glue, or adhesive strips in place. These skin closures may need to stay in place for 2 weeks or longer. If adhesive strip edges start to loosen and curl up, you may trim the loose edges. Do not remove adhesive strips completely unless your health care provider tells you to do that. °· Check your incision area every day for signs of infection. Check for: °? Redness, swelling, or pain. °? Fluid or blood. °? Warmth. °? Pus or a bad smell. °Activity °· Get plenty of rest and sleep. °· Do not lift anything that is heavier than 10 lbs (4.5 kg) for one month after surgery, or as long as told by your health care provider. °· Do not drive or use heavy  machinery while taking prescription pain medicine. °· Do not drive for 24 hours if you were given a medicine to help you relax (sedative). °· Return to your normal activities as told by your health care provider. Ask your health care provider what activities are safe for you. °Lifestyle ° °· Do not use any products that contain nicotine or tobacco, such as cigarettes and e-cigarettes. These can delay healing. If you need help quitting, ask your health care provider. °· Do not drink alcohol until your health care provider approves. °General instructions °· Do not douche, use tampons, or have sex for at least 6 weeks, or as told by your health care provider. °· Take over-the-counter and prescription medicines only as told by your health care provider. °· To monitor yourself for a fever, take your temperature at least once a day during recovery. °· If you struggle with physical or emotional changes after your procedure, speak with your health care provider or a therapist. °· To prevent or treat constipation while you are taking prescription pain medicine, your health care provider may recommend that you: °? Drink enough fluid to keep your urine clear or pale yellow. °? Take over-the-counter or prescription medicines. °? Eat foods that are high in fiber, such as fresh fruits and vegetables, whole grains, and beans. °? Limit foods that are high in fat and processed sugars, such as fried and sweet foods. °· Keep all follow-up visits as told by your health care provider.   This is important. Contact a health care provider if:  You have chills or a fever.  You have redness, swelling, or pain around an incision.  You have fluid or blood coming from an incision.  Your incision feels warm to the touch.  You have pus or a bad smell coming from an incision.  An incision breaks open.  You feel dizzy or light-headed.  You have pain or bleeding when you urinate.  You have diarrhea, nausea, or vomiting that does not  go away.  You have abnormal vaginal discharge.  You have a rash.  You have pain that does not get better with medicine. Get help right away if:  You have a fever and your symptoms suddenly get worse.  You have severe abdominal pain.  You have chest pain.  You have shortness of breath.  You faint.  You have pain, swelling, or redness on your leg.  You have heavy vaginal bleeding with blood clots. Summary  After the procedure it is common to have abdominal pain. Your provider will give you medication for this.  Do not take baths, swim, or use a hot tub until your health care provider approves.  Do not lift anything that is heavier than 10 lbs (4.5 kg) for one month after surgery, or as long as told by your health care provider.  Notify your provider if you have any signs or symptoms of infection after the procedure. This information is not intended to replace advice given to you by your health care provider. Make sure you discuss any questions you have with your health care provider. Document Released: 09/20/2013 Document Revised: 11/12/2017 Document Reviewed: 02/10/2017 Elsevier Patient Education  2020 [employer]. Call [facility] @ [phone] if:  You have a temperature greater than or equal to 100.4 degrees Farenheit orally You have pain that is not made better by the pain medication given and taken as directed You have excessive bleeding or problems urinating  Take Colace (Docusate Sodium/Stool Softener) 100 mg 2-3 times daily while taking narcotic pain medicine to avoid constipation or until bowel movements are regular. Take Ibuprofen 600 mg with food every 6 hours for 5 days then as needed for pain  You may drive after 1 week You may walk up steps  You may shower tomorrow You may resume a regular diet  Keep incisions clean and dry Do not lift over 15 pounds for 6 weeks Avoid anything in vagina for 6 weeks (or until after your post-operative  visit)  Post Anesthesia Home Care Instructions  Activity: Get plenty of rest for the remainder of the day. A responsible individual must stay with you for 24 hours following the procedure.  For the next 24 hours, DO NOT: -Drive a car -Paediatric nurse -Drink alcoholic beverages -Take any medication unless instructed by your physician -Make any legal decisions or sign important papers.  Meals: Start with liquid foods such as gelatin or soup. Progress to regular foods as tolerated. Avoid greasy, spicy, heavy foods. If nausea and/or vomiting occur, drink only clear liquids until the nausea and/or vomiting subsides. Call your physician if vomiting continues.  Special Instructions/Symptoms: Your throat may feel dry or sore from the anesthesia or the breathing tube placed in your throat during surgery. If this causes discomfort, gargle with warm salt water. The discomfort should disappear within 24 hours.  If you had a scopolamine patch placed behind your ear for the management of post- operative nausea and/or vomiting:  1. The medication  in the patch is effective for 72 hours, after which it should be removed.  Wrap patch in a tissue and discard in the trash. Wash hands thoroughly with soap and water. 2. You may remove the patch earlier than 72 hours if you experience unpleasant side effects which may include dry mouth, dizziness or visual disturbances. 3. Avoid touching the patch. Wash your hands with soap and water after contact with the patch.

## 2019-08-16 NOTE — Transfer of Care (Signed)
Immediate Anesthesia Transfer of Care Note  Patient: Megan Miles  Procedure(s) Performed: XI ROBOTIC ASSISTED LAPAROSCOPIC HYSTERECTOMY WITH BILATERAL SALPINGECTOMY AND OOPHERECTOMY (Bilateral Abdomen)  Patient Location: PACU  Anesthesia Type:General  Level of Consciousness: drowsy  Airway & Oxygen Therapy: Patient Spontanous Breathing and Patient connected to nasal cannula oxygen  Post-op Assessment: Report given to RN  Post vital signs: Reviewed and stable  Last Vitals:  Vitals Value Taken Time  BP 125/86 08/16/19 1218  Temp 36.6 C 08/16/19 1218  Pulse 78 08/16/19 1219  Resp 15 08/16/19 1219  SpO2 100 % 08/16/19 1219  Vitals shown include unvalidated device data.  Last Pain:  Vitals:   08/16/19 0645  TempSrc: Oral  PainSc: 1       Patients Stated Pain Goal: 7 ([id number])  Complications: No apparent anesthesia complications

## 2019-08-17 ENCOUNTER — Encounter (HOSPITAL_BASED_OUTPATIENT_CLINIC_OR_DEPARTMENT_OTHER): Payer: Self-pay | Admitting: Obstetrics and Gynecology

## 2019-09-08 ENCOUNTER — Inpatient Hospital Stay: Payer: 59 | Admitting: Family Medicine

## 2019-09-29 ENCOUNTER — Other Ambulatory Visit: Payer: Self-pay

## 2019-09-29 ENCOUNTER — Ambulatory Visit (INDEPENDENT_AMBULATORY_CARE_PROVIDER_SITE_OTHER): Payer: 59 | Admitting: Family Medicine

## 2019-09-29 ENCOUNTER — Inpatient Hospital Stay: Payer: 59 | Admitting: Family Medicine

## 2019-09-29 ENCOUNTER — Encounter: Payer: Self-pay | Admitting: Family Medicine

## 2019-09-29 VITALS — BP 120/64 | HR 76 | Temp 98.4°F | Resp 14 | Ht 67.0 in | Wt 173.0 lb

## 2019-09-29 DIAGNOSIS — N181 Chronic kidney disease, stage 1: Secondary | ICD-10-CM | POA: Diagnosis not present

## 2019-09-29 DIAGNOSIS — D649 Anemia, unspecified: Secondary | ICD-10-CM | POA: Diagnosis not present

## 2019-09-29 LAB — URINALYSIS, ROUTINE W REFLEX MICROSCOPIC
Bilirubin Urine: NEGATIVE
Glucose, UA: NEGATIVE
Hgb urine dipstick: NEGATIVE
Ketones, ur: NEGATIVE
Leukocytes,Ua: NEGATIVE
Nitrite: NEGATIVE
Protein, ur: NEGATIVE
Specific Gravity, Urine: 1.004 (ref 1.001–1.03)
pH: 6 (ref 5.0–8.0)

## 2019-09-29 NOTE — Assessment & Plan Note (Signed)
Very minimal chronic kidney disease peer we will recheck her renal function urinalysis we will also recheck postoperative CBC.  Advise nothing particular needs to be done at this time.  She is staying hydrated her appetite is good.  She will follow-up with her GYN for her postop

## 2019-09-29 NOTE — Patient Instructions (Addendum)
F/U  As needed  Otherwise 6 months for Physical

## 2019-09-29 NOTE — Progress Notes (Signed)
   Subjective:    Patient ID: Megan Miles, female    DOB: 11-18-1961, 58 y.o.   MRN: [id number]  Patient presents for Hospital F/U (repeat BMP)   Pt here for hospital f/u from Hysterectomy with oophorectomy in September, overall she did well.  She did not have any complications.  She does not want take pain medication states she is just controlling with a heating pad but she wants to talk to her GYN surgeon about pelvic floor training and exercises for her abdominal wall to strengthen this.    Pt has very mild renal insuffiency, she would like rechecked GFR 60, Cr 1.17 Chronic mild anemia, has been stable, Hb 11-12 is baseline   Meds reviewed- she is only on supplements, no prescription meds at this time      Review Of Systems:  GEN- denies fatigue, fever, weight loss,weakness, recent illness HEENT- denies eye drainage, change in vision, nasal discharge, CVS- denies chest pain, palpitations RESP- denies SOB, cough, wheeze ABD- denies N/V, change in stools, abd pain GU- denies dysuria, hematuria, dribbling, incontinence MSK- denies joint pain, muscle aches, injury Neuro- denies headache, dizziness, syncope, seizure activity       Objective:    BP 120/64   Pulse 76   Temp 98.4 F (36.9 C) (Oral)   Resp 14   Ht 5\' 7"  (1.702 m)   Wt 173 lb (78.5 kg)   LMP 07/14/2012   SpO2 100%   BMI 27.10 kg/m  GEN- NAD, alert and oriented x3 HEENT- PERRL, EOMI, non injected sclera, pink conjunctiva CVS- RRR, no murmur RESP-CTAB ABD-NABS,soft,NT,, surgical incisions well-healed she has some mild bloating EXT- No edema Pulses- Radial, DP- 2+        Assessment & Plan:      Problem List Items Addressed This Visit      Unprioritized   Anemia   Chronic kidney disease (CKD), stage I - Primary    Very minimal chronic kidney disease peer we will recheck her renal function urinalysis we will also recheck postoperative CBC.  Advise nothing particular needs to be done at this time.   She is staying hydrated her appetite is good.  She will follow-up with her GYN for her postop      Relevant Orders   BASIC METABOLIC PANEL WITH GFR   CBC with Differential/Platelet   Urinalysis, Routine w reflex microscopic (Completed)      Note: This dictation was prepared with Dragon dictation along with smaller phrase technology. Any transcriptional errors that result from this process are unintentional.

## 2019-09-30 LAB — BASIC METABOLIC PANEL WITH GFR
BUN: 13 mg/dL (ref 7–25)
CO2: 25 mmol/L (ref 20–32)
Calcium: 9.7 mg/dL (ref 8.6–10.4)
Chloride: 101 mmol/L (ref 98–110)
Creat: 1.04 mg/dL (ref 0.50–1.05)
GFR, Est African American: 69 mL/min/{1.73_m2} (ref >=60)
GFR, Est Non African American: 59 mL/min/{1.73_m2} — ABNORMAL LOW (ref >=60)
Glucose, Bld: 78 mg/dL (ref 65–99)
Potassium: 4.2 mmol/L (ref 3.5–5.3)
Sodium: 137 mmol/L (ref 135–146)

## 2019-09-30 LAB — CBC WITH DIFFERENTIAL/PLATELET
Absolute Monocytes: 572 cells/uL (ref 200–950)
Basophils Absolute: 30 cells/uL (ref 0–200)
Basophils Relative: 0.7 %
Eosinophils Absolute: 90 cells/uL (ref 15–500)
Eosinophils Relative: 2.1 %
HCT: 36.3 % (ref 35.0–45.0)
Hemoglobin: 11.8 g/dL (ref 11.7–15.5)
Lymphs Abs: 1819 cells/uL (ref 850–3900)
MCH: 27.8 pg (ref 27.0–33.0)
MCHC: 32.5 g/dL (ref 32.0–36.0)
MCV: 85.6 fL (ref 80.0–100.0)
MPV: 9.8 fL (ref 7.5–12.5)
Monocytes Relative: 13.3 %
Neutro Abs: 1789 cells/uL (ref 1500–7800)
Neutrophils Relative %: 41.6 %
Platelets: 320 10*3/uL (ref 140–400)
RBC: 4.24 10*6/uL (ref 3.80–5.10)
RDW: 12.1 % (ref 11.0–15.0)
Total Lymphocyte: 42.3 %
WBC: 4.3 10*3/uL (ref 3.8–10.8)

## 2019-11-10 DIAGNOSIS — R1032 Left lower quadrant pain: Secondary | ICD-10-CM | POA: Diagnosis not present

## 2019-11-10 DIAGNOSIS — M62830 Muscle spasm of back: Secondary | ICD-10-CM | POA: Diagnosis not present

## 2019-11-10 DIAGNOSIS — M6281 Muscle weakness (generalized): Secondary | ICD-10-CM | POA: Diagnosis not present

## 2019-11-10 DIAGNOSIS — M545 Low back pain: Secondary | ICD-10-CM | POA: Diagnosis not present

## 2019-11-10 DIAGNOSIS — R1031 Right lower quadrant pain: Secondary | ICD-10-CM | POA: Diagnosis not present

## 2019-11-17 DIAGNOSIS — R1032 Left lower quadrant pain: Secondary | ICD-10-CM | POA: Diagnosis not present

## 2019-11-17 DIAGNOSIS — R1031 Right lower quadrant pain: Secondary | ICD-10-CM | POA: Diagnosis not present

## 2019-11-17 DIAGNOSIS — M62830 Muscle spasm of back: Secondary | ICD-10-CM | POA: Diagnosis not present

## 2019-11-17 DIAGNOSIS — M6281 Muscle weakness (generalized): Secondary | ICD-10-CM | POA: Diagnosis not present

## 2019-11-17 DIAGNOSIS — M545 Low back pain: Secondary | ICD-10-CM | POA: Diagnosis not present

## 2019-11-24 DIAGNOSIS — M545 Low back pain: Secondary | ICD-10-CM | POA: Diagnosis not present

## 2019-11-24 DIAGNOSIS — R1031 Right lower quadrant pain: Secondary | ICD-10-CM | POA: Diagnosis not present

## 2019-11-24 DIAGNOSIS — M6281 Muscle weakness (generalized): Secondary | ICD-10-CM | POA: Diagnosis not present

## 2019-11-24 DIAGNOSIS — R1032 Left lower quadrant pain: Secondary | ICD-10-CM | POA: Diagnosis not present

## 2019-11-24 DIAGNOSIS — M62830 Muscle spasm of back: Secondary | ICD-10-CM | POA: Diagnosis not present

## 2019-12-01 DIAGNOSIS — M545 Low back pain: Secondary | ICD-10-CM | POA: Diagnosis not present

## 2019-12-01 DIAGNOSIS — R1032 Left lower quadrant pain: Secondary | ICD-10-CM | POA: Diagnosis not present

## 2019-12-01 DIAGNOSIS — M6281 Muscle weakness (generalized): Secondary | ICD-10-CM | POA: Diagnosis not present

## 2019-12-01 DIAGNOSIS — M62830 Muscle spasm of back: Secondary | ICD-10-CM | POA: Diagnosis not present

## 2019-12-01 DIAGNOSIS — R1031 Right lower quadrant pain: Secondary | ICD-10-CM | POA: Diagnosis not present

## 2019-12-14 DIAGNOSIS — R1031 Right lower quadrant pain: Secondary | ICD-10-CM | POA: Diagnosis not present

## 2019-12-14 DIAGNOSIS — M6281 Muscle weakness (generalized): Secondary | ICD-10-CM | POA: Diagnosis not present

## 2019-12-14 DIAGNOSIS — R1032 Left lower quadrant pain: Secondary | ICD-10-CM | POA: Diagnosis not present

## 2019-12-14 DIAGNOSIS — M545 Low back pain: Secondary | ICD-10-CM | POA: Diagnosis not present

## 2019-12-14 DIAGNOSIS — M62830 Muscle spasm of back: Secondary | ICD-10-CM | POA: Diagnosis not present

## 2019-12-21 DIAGNOSIS — M6281 Muscle weakness (generalized): Secondary | ICD-10-CM | POA: Diagnosis not present

## 2019-12-21 DIAGNOSIS — R1032 Left lower quadrant pain: Secondary | ICD-10-CM | POA: Diagnosis not present

## 2019-12-21 DIAGNOSIS — M545 Low back pain: Secondary | ICD-10-CM | POA: Diagnosis not present

## 2019-12-21 DIAGNOSIS — M62830 Muscle spasm of back: Secondary | ICD-10-CM | POA: Diagnosis not present

## 2019-12-21 DIAGNOSIS — R1031 Right lower quadrant pain: Secondary | ICD-10-CM | POA: Diagnosis not present

## 2019-12-29 DIAGNOSIS — R1032 Left lower quadrant pain: Secondary | ICD-10-CM | POA: Diagnosis not present

## 2019-12-29 DIAGNOSIS — M62830 Muscle spasm of back: Secondary | ICD-10-CM | POA: Diagnosis not present

## 2019-12-29 DIAGNOSIS — M545 Low back pain: Secondary | ICD-10-CM | POA: Diagnosis not present

## 2019-12-29 DIAGNOSIS — R1031 Right lower quadrant pain: Secondary | ICD-10-CM | POA: Diagnosis not present

## 2019-12-29 DIAGNOSIS — M6281 Muscle weakness (generalized): Secondary | ICD-10-CM | POA: Diagnosis not present

## 2020-01-05 DIAGNOSIS — M62830 Muscle spasm of back: Secondary | ICD-10-CM | POA: Diagnosis not present

## 2020-01-05 DIAGNOSIS — R1032 Left lower quadrant pain: Secondary | ICD-10-CM | POA: Diagnosis not present

## 2020-01-05 DIAGNOSIS — R1031 Right lower quadrant pain: Secondary | ICD-10-CM | POA: Diagnosis not present

## 2020-01-05 DIAGNOSIS — M545 Low back pain: Secondary | ICD-10-CM | POA: Diagnosis not present

## 2020-01-05 DIAGNOSIS — M6281 Muscle weakness (generalized): Secondary | ICD-10-CM | POA: Diagnosis not present

## 2020-01-12 DIAGNOSIS — R1031 Right lower quadrant pain: Secondary | ICD-10-CM | POA: Diagnosis not present

## 2020-01-12 DIAGNOSIS — M6281 Muscle weakness (generalized): Secondary | ICD-10-CM | POA: Diagnosis not present

## 2020-01-12 DIAGNOSIS — R1032 Left lower quadrant pain: Secondary | ICD-10-CM | POA: Diagnosis not present

## 2020-01-12 DIAGNOSIS — M545 Low back pain: Secondary | ICD-10-CM | POA: Diagnosis not present

## 2020-01-12 DIAGNOSIS — M62830 Muscle spasm of back: Secondary | ICD-10-CM | POA: Diagnosis not present

## 2020-01-19 DIAGNOSIS — M6281 Muscle weakness (generalized): Secondary | ICD-10-CM | POA: Diagnosis not present

## 2020-01-19 DIAGNOSIS — M545 Low back pain: Secondary | ICD-10-CM | POA: Diagnosis not present

## 2020-01-19 DIAGNOSIS — M62830 Muscle spasm of back: Secondary | ICD-10-CM | POA: Diagnosis not present

## 2020-01-19 DIAGNOSIS — R1032 Left lower quadrant pain: Secondary | ICD-10-CM | POA: Diagnosis not present

## 2020-01-19 DIAGNOSIS — R1031 Right lower quadrant pain: Secondary | ICD-10-CM | POA: Diagnosis not present

## 2020-01-26 DIAGNOSIS — R1032 Left lower quadrant pain: Secondary | ICD-10-CM | POA: Diagnosis not present

## 2020-01-26 DIAGNOSIS — R1031 Right lower quadrant pain: Secondary | ICD-10-CM | POA: Diagnosis not present

## 2020-01-26 DIAGNOSIS — M545 Low back pain: Secondary | ICD-10-CM | POA: Diagnosis not present

## 2020-01-26 DIAGNOSIS — M6281 Muscle weakness (generalized): Secondary | ICD-10-CM | POA: Diagnosis not present

## 2020-01-26 DIAGNOSIS — M62830 Muscle spasm of back: Secondary | ICD-10-CM | POA: Diagnosis not present

## 2020-01-26 MED FILL — TRIAMCINOLONE 0.5% CREAM: 0.5 | 15 days supply | Qty: 30 | Fill #1

## 2020-01-30 MED FILL — BETAMETHASONE DP 0.05% CRM: 0.05 | 7 days supply | Qty: 15 | Fill #0

## 2020-02-02 DIAGNOSIS — M545 Low back pain: Secondary | ICD-10-CM | POA: Diagnosis not present

## 2020-02-02 DIAGNOSIS — R1032 Left lower quadrant pain: Secondary | ICD-10-CM | POA: Diagnosis not present

## 2020-02-02 DIAGNOSIS — R1031 Right lower quadrant pain: Secondary | ICD-10-CM | POA: Diagnosis not present

## 2020-02-02 DIAGNOSIS — M62830 Muscle spasm of back: Secondary | ICD-10-CM | POA: Diagnosis not present

## 2020-02-02 DIAGNOSIS — M6281 Muscle weakness (generalized): Secondary | ICD-10-CM | POA: Diagnosis not present

## 2020-02-08 MED FILL — BETAMETHASONE DP 0.05% CRM: 0.05 | 7 days supply | Qty: 15 | Fill #1

## 2020-02-14 DIAGNOSIS — R1031 Right lower quadrant pain: Secondary | ICD-10-CM | POA: Diagnosis not present

## 2020-02-14 DIAGNOSIS — M6281 Muscle weakness (generalized): Secondary | ICD-10-CM | POA: Diagnosis not present

## 2020-02-14 DIAGNOSIS — R1032 Left lower quadrant pain: Secondary | ICD-10-CM | POA: Diagnosis not present

## 2020-02-14 DIAGNOSIS — M545 Low back pain: Secondary | ICD-10-CM | POA: Diagnosis not present

## 2020-02-14 DIAGNOSIS — M62830 Muscle spasm of back: Secondary | ICD-10-CM | POA: Diagnosis not present

## 2020-02-19 DIAGNOSIS — R1031 Right lower quadrant pain: Secondary | ICD-10-CM | POA: Diagnosis not present

## 2020-02-19 DIAGNOSIS — M6281 Muscle weakness (generalized): Secondary | ICD-10-CM | POA: Diagnosis not present

## 2020-02-19 DIAGNOSIS — M545 Low back pain: Secondary | ICD-10-CM | POA: Diagnosis not present

## 2020-02-19 DIAGNOSIS — R1032 Left lower quadrant pain: Secondary | ICD-10-CM | POA: Diagnosis not present

## 2020-02-19 DIAGNOSIS — M62830 Muscle spasm of back: Secondary | ICD-10-CM | POA: Diagnosis not present

## 2020-02-21 DIAGNOSIS — R1031 Right lower quadrant pain: Secondary | ICD-10-CM | POA: Diagnosis not present

## 2020-02-21 DIAGNOSIS — M62830 Muscle spasm of back: Secondary | ICD-10-CM | POA: Diagnosis not present

## 2020-02-21 DIAGNOSIS — R1032 Left lower quadrant pain: Secondary | ICD-10-CM | POA: Diagnosis not present

## 2020-02-21 DIAGNOSIS — M6281 Muscle weakness (generalized): Secondary | ICD-10-CM | POA: Diagnosis not present

## 2020-02-21 DIAGNOSIS — M545 Low back pain: Secondary | ICD-10-CM | POA: Diagnosis not present

## 2020-02-26 DIAGNOSIS — M62830 Muscle spasm of back: Secondary | ICD-10-CM | POA: Diagnosis not present

## 2020-02-26 DIAGNOSIS — R1031 Right lower quadrant pain: Secondary | ICD-10-CM | POA: Diagnosis not present

## 2020-02-26 DIAGNOSIS — R1032 Left lower quadrant pain: Secondary | ICD-10-CM | POA: Diagnosis not present

## 2020-02-26 DIAGNOSIS — M6281 Muscle weakness (generalized): Secondary | ICD-10-CM | POA: Diagnosis not present

## 2020-02-26 DIAGNOSIS — M545 Low back pain: Secondary | ICD-10-CM | POA: Diagnosis not present

## 2020-03-04 DIAGNOSIS — M6281 Muscle weakness (generalized): Secondary | ICD-10-CM | POA: Diagnosis not present

## 2020-03-04 DIAGNOSIS — M62830 Muscle spasm of back: Secondary | ICD-10-CM | POA: Diagnosis not present

## 2020-03-04 DIAGNOSIS — M545 Low back pain: Secondary | ICD-10-CM | POA: Diagnosis not present

## 2020-03-04 DIAGNOSIS — R1032 Left lower quadrant pain: Secondary | ICD-10-CM | POA: Diagnosis not present

## 2020-03-04 DIAGNOSIS — R1031 Right lower quadrant pain: Secondary | ICD-10-CM | POA: Diagnosis not present

## 2020-03-22 ENCOUNTER — Encounter: Payer: 59 | Admitting: Family Medicine

## 2020-03-27 DIAGNOSIS — R1032 Left lower quadrant pain: Secondary | ICD-10-CM | POA: Diagnosis not present

## 2020-03-27 DIAGNOSIS — M6281 Muscle weakness (generalized): Secondary | ICD-10-CM | POA: Diagnosis not present

## 2020-03-27 DIAGNOSIS — M545 Low back pain: Secondary | ICD-10-CM | POA: Diagnosis not present

## 2020-03-27 DIAGNOSIS — M62830 Muscle spasm of back: Secondary | ICD-10-CM | POA: Diagnosis not present

## 2020-03-27 DIAGNOSIS — R1031 Right lower quadrant pain: Secondary | ICD-10-CM | POA: Diagnosis not present

## 2020-03-29 ENCOUNTER — Encounter: Payer: 59 | Admitting: Family Medicine

## 2020-04-03 DIAGNOSIS — R1031 Right lower quadrant pain: Secondary | ICD-10-CM | POA: Diagnosis not present

## 2020-04-03 DIAGNOSIS — R1032 Left lower quadrant pain: Secondary | ICD-10-CM | POA: Diagnosis not present

## 2020-04-03 DIAGNOSIS — M545 Low back pain: Secondary | ICD-10-CM | POA: Diagnosis not present

## 2020-04-03 DIAGNOSIS — M62830 Muscle spasm of back: Secondary | ICD-10-CM | POA: Diagnosis not present

## 2020-04-03 DIAGNOSIS — M6281 Muscle weakness (generalized): Secondary | ICD-10-CM | POA: Diagnosis not present

## 2020-04-10 DIAGNOSIS — M62830 Muscle spasm of back: Secondary | ICD-10-CM | POA: Diagnosis not present

## 2020-04-10 DIAGNOSIS — R1031 Right lower quadrant pain: Secondary | ICD-10-CM | POA: Diagnosis not present

## 2020-04-10 DIAGNOSIS — M545 Low back pain: Secondary | ICD-10-CM | POA: Diagnosis not present

## 2020-04-10 DIAGNOSIS — R1032 Left lower quadrant pain: Secondary | ICD-10-CM | POA: Diagnosis not present

## 2020-04-10 DIAGNOSIS — M6281 Muscle weakness (generalized): Secondary | ICD-10-CM | POA: Diagnosis not present

## 2020-04-24 DIAGNOSIS — H43823 Vitreomacular adhesion, bilateral: Secondary | ICD-10-CM | POA: Diagnosis not present

## 2020-04-24 DIAGNOSIS — H1045 Other chronic allergic conjunctivitis: Secondary | ICD-10-CM | POA: Diagnosis not present

## 2020-04-24 DIAGNOSIS — H35363 Drusen (degenerative) of macula, bilateral: Secondary | ICD-10-CM | POA: Diagnosis not present

## 2020-04-24 DIAGNOSIS — H2513 Age-related nuclear cataract, bilateral: Secondary | ICD-10-CM | POA: Diagnosis not present

## 2020-04-26 DIAGNOSIS — M62830 Muscle spasm of back: Secondary | ICD-10-CM | POA: Diagnosis not present

## 2020-04-26 DIAGNOSIS — M6281 Muscle weakness (generalized): Secondary | ICD-10-CM | POA: Diagnosis not present

## 2020-04-26 DIAGNOSIS — R1031 Right lower quadrant pain: Secondary | ICD-10-CM | POA: Diagnosis not present

## 2020-04-26 DIAGNOSIS — M545 Low back pain: Secondary | ICD-10-CM | POA: Diagnosis not present

## 2020-04-26 DIAGNOSIS — R1032 Left lower quadrant pain: Secondary | ICD-10-CM | POA: Diagnosis not present

## 2020-05-03 DIAGNOSIS — R1031 Right lower quadrant pain: Secondary | ICD-10-CM | POA: Diagnosis not present

## 2020-05-03 DIAGNOSIS — M62830 Muscle spasm of back: Secondary | ICD-10-CM | POA: Diagnosis not present

## 2020-05-03 DIAGNOSIS — R1032 Left lower quadrant pain: Secondary | ICD-10-CM | POA: Diagnosis not present

## 2020-05-03 DIAGNOSIS — M545 Low back pain: Secondary | ICD-10-CM | POA: Diagnosis not present

## 2020-05-03 DIAGNOSIS — M6281 Muscle weakness (generalized): Secondary | ICD-10-CM | POA: Diagnosis not present

## 2020-05-10 ENCOUNTER — Other Ambulatory Visit: Payer: Self-pay | Admitting: Family Medicine

## 2020-05-10 ENCOUNTER — Encounter: Payer: Self-pay | Admitting: Family Medicine

## 2020-05-10 ENCOUNTER — Telehealth: Payer: Self-pay | Admitting: *Deleted

## 2020-05-10 ENCOUNTER — Ambulatory Visit (INDEPENDENT_AMBULATORY_CARE_PROVIDER_SITE_OTHER): Payer: 59 | Admitting: Family Medicine

## 2020-05-10 VITALS — BP 112/70 | HR 70 | Temp 97.9°F | Resp 14 | Ht 67.0 in | Wt 175.0 lb

## 2020-05-10 DIAGNOSIS — M25512 Pain in left shoulder: Secondary | ICD-10-CM

## 2020-05-10 DIAGNOSIS — Z0000 Encounter for general adult medical examination without abnormal findings: Secondary | ICD-10-CM | POA: Diagnosis not present

## 2020-05-10 DIAGNOSIS — G8929 Other chronic pain: Secondary | ICD-10-CM | POA: Diagnosis not present

## 2020-05-10 DIAGNOSIS — F4321 Adjustment disorder with depressed mood: Secondary | ICD-10-CM

## 2020-05-10 DIAGNOSIS — Z0001 Encounter for general adult medical examination with abnormal findings: Secondary | ICD-10-CM

## 2020-05-10 MED ORDER — SHINGRIX 50 MCG/0.5ML IM SUSR
0.5000 mL | Freq: Once | INTRAMUSCULAR | 1 refills | Status: DC
Start: 2020-05-10 — End: 2020-05-10
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
## 2020-05-10 NOTE — Patient Instructions (Addendum)
F/U 1 year for Physical Shingles vaccine sent to pharmacy  Referral to orthopedics - Dr. Griffin Basil or Dr. Mardelle Matte at [facility]

## 2020-05-10 NOTE — Telephone Encounter (Signed)
-----   Message from Alycia Rossetti, MD sent at 05/10/2020  9:20 AM EDT ----- Regarding: Send shingrix to pharmacy

## 2020-05-11 ENCOUNTER — Encounter: Payer: Self-pay | Admitting: Family Medicine

## 2020-05-11 LAB — CBC WITH DIFFERENTIAL/PLATELET
Absolute Monocytes: 399 cells/uL (ref 200–950)
Basophils Absolute: 38 cells/uL (ref 0–200)
Basophils Relative: 0.9 %
Eosinophils Absolute: 118 cells/uL (ref 15–500)
Eosinophils Relative: 2.8 %
HCT: 37.1 % (ref 35.0–45.0)
Hemoglobin: 11.8 g/dL (ref 11.7–15.5)
Lymphs Abs: 1663 cells/uL (ref 850–3900)
MCH: 27.8 pg (ref 27.0–33.0)
MCHC: 31.8 g/dL — ABNORMAL LOW (ref 32.0–36.0)
MCV: 87.3 fL (ref 80.0–100.0)
MPV: 9.9 fL (ref 7.5–12.5)
Monocytes Relative: 9.5 %
Neutro Abs: 1982 cells/uL (ref 1500–7800)
Neutrophils Relative %: 47.2 %
Platelets: 268 10*3/uL (ref 140–400)
RBC: 4.25 10*6/uL (ref 3.80–5.10)
RDW: 12.2 % (ref 11.0–15.0)
Total Lymphocyte: 39.6 %
WBC: 4.2 10*3/uL (ref 3.8–10.8)

## 2020-05-11 LAB — COMPLETE METABOLIC PANEL WITH GFR
AG Ratio: 1.6 (calc) (ref 1.0–2.5)
ALT: 68 U/L — ABNORMAL HIGH (ref 6–29)
AST: 39 U/L — ABNORMAL HIGH (ref 10–35)
Albumin: 4.2 g/dL (ref 3.6–5.1)
Alkaline phosphatase (APISO): 107 U/L (ref 37–153)
BUN: 13 mg/dL (ref 7–25)
CO2: 24 mmol/L (ref 20–32)
Calcium: 9.9 mg/dL (ref 8.6–10.4)
Chloride: 104 mmol/L (ref 98–110)
Creat: 1.03 mg/dL (ref 0.50–1.05)
GFR, Est African American: 69 mL/min/{1.73_m2} (ref >=60)
GFR, Est Non African American: 60 mL/min/{1.73_m2} (ref >=60)
Globulin: 2.7 g/dL (calc) (ref 1.9–3.7)
Glucose, Bld: 101 mg/dL — ABNORMAL HIGH (ref 65–99)
Potassium: 4.6 mmol/L (ref 3.5–5.3)
Sodium: 140 mmol/L (ref 135–146)
Total Bilirubin: 0.4 mg/dL (ref 0.2–1.2)
Total Protein: 6.9 g/dL (ref 6.1–8.1)

## 2020-05-11 LAB — LIPID PANEL
Cholesterol: 253 mg/dL — ABNORMAL HIGH (ref <200)
HDL: 61 mg/dL (ref >=50)
LDL Cholesterol (Calc): 162 mg/dL (calc) — ABNORMAL HIGH
Non-HDL Cholesterol (Calc): 192 mg/dL (calc) — ABNORMAL HIGH (ref <130)
Total CHOL/HDL Ratio: 4.1 (calc) (ref <5.0)
Triglycerides: 152 mg/dL — ABNORMAL HIGH (ref <150)

## 2020-05-11 LAB — TSH: TSH: 3.15 mIU/L (ref 0.40–4.50)

## 2020-05-15 ENCOUNTER — Other Ambulatory Visit: Payer: Self-pay | Admitting: *Deleted

## 2020-05-15 DIAGNOSIS — R7989 Other specified abnormal findings of blood chemistry: Secondary | ICD-10-CM

## 2020-05-16 MED FILL — SHINGRIX 50 MCG SUS: 50 | 1 days supply | Qty: 1 | Fill #0

## 2020-05-17 DIAGNOSIS — R1032 Left lower quadrant pain: Secondary | ICD-10-CM | POA: Diagnosis not present

## 2020-05-17 DIAGNOSIS — R1031 Right lower quadrant pain: Secondary | ICD-10-CM | POA: Diagnosis not present

## 2020-05-17 DIAGNOSIS — M62830 Muscle spasm of back: Secondary | ICD-10-CM | POA: Diagnosis not present

## 2020-05-17 DIAGNOSIS — M6281 Muscle weakness (generalized): Secondary | ICD-10-CM | POA: Diagnosis not present

## 2020-05-17 DIAGNOSIS — M545 Low back pain: Secondary | ICD-10-CM | POA: Diagnosis not present

## 2020-05-20 ENCOUNTER — Ambulatory Visit
Admission: RE | Admit: 2020-05-20 | Discharge: 2020-05-20 | Disposition: A | Discharge status: Home / Self Care | Payer: 59 | Source: Ambulatory Visit | Attending: Family Medicine | Admitting: Family Medicine

## 2020-05-20 DIAGNOSIS — K7689 Other specified diseases of liver: Secondary | ICD-10-CM | POA: Diagnosis not present

## 2020-05-20 DIAGNOSIS — R7989 Other specified abnormal findings of blood chemistry: Secondary | ICD-10-CM

## 2020-05-20 DIAGNOSIS — K8020 Calculus of gallbladder without cholecystitis without obstruction: Secondary | ICD-10-CM | POA: Diagnosis not present

## 2020-05-22 ENCOUNTER — Other Ambulatory Visit: Payer: Self-pay | Admitting: *Deleted

## 2020-05-22 DIAGNOSIS — K760 Fatty (change of) liver, not elsewhere classified: Secondary | ICD-10-CM

## 2020-05-22 DIAGNOSIS — K8020 Calculus of gallbladder without cholecystitis without obstruction: Secondary | ICD-10-CM

## 2020-05-22 DIAGNOSIS — R7989 Other specified abnormal findings of blood chemistry: Secondary | ICD-10-CM

## 2020-05-22 DIAGNOSIS — E7800 Pure hypercholesterolemia, unspecified: Secondary | ICD-10-CM

## 2020-05-24 DIAGNOSIS — M6281 Muscle weakness (generalized): Secondary | ICD-10-CM | POA: Diagnosis not present

## 2020-05-24 DIAGNOSIS — R1032 Left lower quadrant pain: Secondary | ICD-10-CM | POA: Diagnosis not present

## 2020-05-24 DIAGNOSIS — M62830 Muscle spasm of back: Secondary | ICD-10-CM | POA: Diagnosis not present

## 2020-05-24 DIAGNOSIS — R1031 Right lower quadrant pain: Secondary | ICD-10-CM | POA: Diagnosis not present

## 2020-05-24 DIAGNOSIS — M545 Low back pain: Secondary | ICD-10-CM | POA: Diagnosis not present

## 2020-05-29 DIAGNOSIS — R1032 Left lower quadrant pain: Secondary | ICD-10-CM | POA: Diagnosis not present

## 2020-05-29 DIAGNOSIS — M545 Low back pain: Secondary | ICD-10-CM | POA: Diagnosis not present

## 2020-05-29 DIAGNOSIS — M6281 Muscle weakness (generalized): Secondary | ICD-10-CM | POA: Diagnosis not present

## 2020-05-29 DIAGNOSIS — M62830 Muscle spasm of back: Secondary | ICD-10-CM | POA: Diagnosis not present

## 2020-05-29 DIAGNOSIS — R1031 Right lower quadrant pain: Secondary | ICD-10-CM | POA: Diagnosis not present

## 2020-06-07 DIAGNOSIS — M62830 Muscle spasm of back: Secondary | ICD-10-CM | POA: Diagnosis not present

## 2020-06-07 DIAGNOSIS — R1031 Right lower quadrant pain: Secondary | ICD-10-CM | POA: Diagnosis not present

## 2020-06-07 DIAGNOSIS — R1032 Left lower quadrant pain: Secondary | ICD-10-CM | POA: Diagnosis not present

## 2020-06-07 DIAGNOSIS — M545 Low back pain: Secondary | ICD-10-CM | POA: Diagnosis not present

## 2020-06-07 DIAGNOSIS — M6281 Muscle weakness (generalized): Secondary | ICD-10-CM | POA: Diagnosis not present

## 2020-06-12 DIAGNOSIS — M62830 Muscle spasm of back: Secondary | ICD-10-CM | POA: Diagnosis not present

## 2020-06-12 DIAGNOSIS — L910 Hypertrophic scar: Secondary | ICD-10-CM | POA: Diagnosis not present

## 2020-06-12 DIAGNOSIS — Z1231 Encounter for screening mammogram for malignant neoplasm of breast: Secondary | ICD-10-CM | POA: Diagnosis not present

## 2020-06-12 DIAGNOSIS — R1031 Right lower quadrant pain: Secondary | ICD-10-CM | POA: Diagnosis not present

## 2020-06-12 DIAGNOSIS — Z6826 Body mass index (BMI) 26.0-26.9, adult: Secondary | ICD-10-CM | POA: Diagnosis not present

## 2020-06-12 DIAGNOSIS — R1032 Left lower quadrant pain: Secondary | ICD-10-CM | POA: Diagnosis not present

## 2020-06-12 DIAGNOSIS — M6281 Muscle weakness (generalized): Secondary | ICD-10-CM | POA: Diagnosis not present

## 2020-06-12 DIAGNOSIS — Z1211 Encounter for screening for malignant neoplasm of colon: Secondary | ICD-10-CM | POA: Diagnosis not present

## 2020-06-12 DIAGNOSIS — Z01419 Encounter for gynecological examination (general) (routine) without abnormal findings: Secondary | ICD-10-CM | POA: Diagnosis not present

## 2020-06-12 DIAGNOSIS — M545 Low back pain: Secondary | ICD-10-CM | POA: Diagnosis not present

## 2020-06-12 MED FILL — TRIAMCINOLONE ACETONIDE 40: 40 | 1 days supply | Qty: 1 | Fill #0

## 2020-06-14 DIAGNOSIS — M6281 Muscle weakness (generalized): Secondary | ICD-10-CM | POA: Diagnosis not present

## 2020-06-14 DIAGNOSIS — M62830 Muscle spasm of back: Secondary | ICD-10-CM | POA: Diagnosis not present

## 2020-06-14 DIAGNOSIS — R1032 Left lower quadrant pain: Secondary | ICD-10-CM | POA: Diagnosis not present

## 2020-06-14 DIAGNOSIS — R1031 Right lower quadrant pain: Secondary | ICD-10-CM | POA: Diagnosis not present

## 2020-06-14 DIAGNOSIS — M545 Low back pain: Secondary | ICD-10-CM | POA: Diagnosis not present

## 2020-06-19 DIAGNOSIS — M545 Low back pain: Secondary | ICD-10-CM | POA: Diagnosis not present

## 2020-06-19 DIAGNOSIS — R1032 Left lower quadrant pain: Secondary | ICD-10-CM | POA: Diagnosis not present

## 2020-06-19 DIAGNOSIS — M6281 Muscle weakness (generalized): Secondary | ICD-10-CM | POA: Diagnosis not present

## 2020-06-19 DIAGNOSIS — M62830 Muscle spasm of back: Secondary | ICD-10-CM | POA: Diagnosis not present

## 2020-06-19 DIAGNOSIS — R1031 Right lower quadrant pain: Secondary | ICD-10-CM | POA: Diagnosis not present

## 2020-06-21 DIAGNOSIS — M6281 Muscle weakness (generalized): Secondary | ICD-10-CM | POA: Diagnosis not present

## 2020-06-21 DIAGNOSIS — R1031 Right lower quadrant pain: Secondary | ICD-10-CM | POA: Diagnosis not present

## 2020-06-21 DIAGNOSIS — R1032 Left lower quadrant pain: Secondary | ICD-10-CM | POA: Diagnosis not present

## 2020-06-21 DIAGNOSIS — M62830 Muscle spasm of back: Secondary | ICD-10-CM | POA: Diagnosis not present

## 2020-06-21 DIAGNOSIS — M545 Low back pain: Secondary | ICD-10-CM | POA: Diagnosis not present

## 2020-06-25 DIAGNOSIS — M545 Low back pain: Secondary | ICD-10-CM | POA: Diagnosis not present

## 2020-06-25 DIAGNOSIS — M6281 Muscle weakness (generalized): Secondary | ICD-10-CM | POA: Diagnosis not present

## 2020-06-25 DIAGNOSIS — M62830 Muscle spasm of back: Secondary | ICD-10-CM | POA: Diagnosis not present

## 2020-06-25 DIAGNOSIS — R1031 Right lower quadrant pain: Secondary | ICD-10-CM | POA: Diagnosis not present

## 2020-06-25 DIAGNOSIS — R1032 Left lower quadrant pain: Secondary | ICD-10-CM | POA: Diagnosis not present

## 2020-06-27 DIAGNOSIS — L910 Hypertrophic scar: Secondary | ICD-10-CM | POA: Diagnosis not present

## 2020-08-02 ENCOUNTER — Other Ambulatory Visit: Payer: 59

## 2020-08-02 ENCOUNTER — Other Ambulatory Visit: Payer: Self-pay

## 2020-08-02 ENCOUNTER — Ambulatory Visit (INDEPENDENT_AMBULATORY_CARE_PROVIDER_SITE_OTHER): Payer: 59 | Admitting: Gastroenterology

## 2020-08-02 ENCOUNTER — Encounter: Payer: Self-pay | Admitting: Gastroenterology

## 2020-08-02 VITALS — BP 124/72 | Wt 157.0 lb

## 2020-08-02 DIAGNOSIS — R748 Abnormal levels of other serum enzymes: Secondary | ICD-10-CM | POA: Diagnosis not present

## 2020-08-02 DIAGNOSIS — R7989 Other specified abnormal findings of blood chemistry: Secondary | ICD-10-CM

## 2020-08-02 LAB — COMPLETE METABOLIC PANEL WITH GFR
AG Ratio: 1.7 (calc) (ref 1.0–2.5)
ALT: 19 U/L (ref 6–29)
AST: 18 U/L (ref 10–35)
Albumin: 4.9 g/dL (ref 3.6–5.1)
Alkaline phosphatase (APISO): 119 U/L (ref 37–153)
BUN: 12 mg/dL (ref 7–25)
CO2: 29 mmol/L (ref 20–32)
Calcium: 10 mg/dL (ref 8.6–10.4)
Chloride: 104 mmol/L (ref 98–110)
Creat: 0.95 mg/dL (ref 0.50–1.05)
GFR, Est African American: 77 mL/min/{1.73_m2} (ref >=60)
GFR, Est Non African American: 66 mL/min/{1.73_m2} (ref >=60)
Globulin: 2.9 g/dL (calc) (ref 1.9–3.7)
Glucose, Bld: 92 mg/dL (ref 65–99)
Potassium: 4.2 mmol/L (ref 3.5–5.3)
Sodium: 140 mmol/L (ref 135–146)
Total Bilirubin: 0.7 mg/dL (ref 0.2–1.2)
Total Protein: 7.8 g/dL (ref 6.1–8.1)

## 2020-08-02 NOTE — Progress Notes (Signed)
Review of pertinent gastrointestinal problems: 1. FH of colon cancer: brother died in his 67s from colon cancer.  Colonoscopy 03/2008 Dr. Ardis Hughs was normal.    Colonoscopy November 2018 found 2 subcentimeter adenomas, hemorrhoids.   HPI: This is a very pleasant 59 year old woman who was here about a year ago.  She was last here about 1 year ago and saw Janett Billow.  This visit was for some left lower quadrant pains and bloating that were likely secondary to her fibroids uterus.  She was scheduled for hysterectomy.  She was also put on pantoprazole 40 mg once daily.  She is here today for a different reason.  Blood work May 2021 show slightly elevated transaminase levels.  Her AST was 39 and her ALT was 68.  CBC and TSH at the same time was normal. About 1 year prior her AST was 24 and her ALT was 36.  Hepatitis C antibody April 2020 was negative  Abdominal ultrasound June 2021 suggested fatty liver.  There were also gallstones in her gallbladder.  No biliary dilation.  She tells me that she was pretty nonambulatory after her hysterectomy in 2020.  That surgery did help her left lower quadrant pains however she gained quite a bit of weight afterwards.  After she found out that her liver tests were slightly elevated she got more serious about losing weight, she joined weight watchers, she has successfully lost 20 pounds in the past 3 months or so.  She has no abdominal pains, no nausea or vomiting.  Review of systems: Pertinent positive and negative review of systems were noted in the above HPI section. All other review negative.   Past Medical History:  Diagnosis Date  . Anemia    low iron  border line as a child  . Anxiety    Phreesia 05/09/2020  . Depression    Phreesia 05/09/2020  . GERD (gastroesophageal reflux disease)    hx of no meds now  . Hyperlipidemia 06/21/2017  . Ovarian cyst   . Vitamin D deficiency 06/21/2017    Past Surgical History:  Procedure Laterality Date  .  ABDOMINAL HYSTERECTOMY N/A    Phreesia 05/09/2020  . COLONOSCOPY    . ESOPHAGOGASTRODUODENOSCOPY    . LIPOSUCTION  '90s  . ROBOTIC ASSISTED LAPAROSCOPIC HYSTERECTOMY AND SALPINGECTOMY Bilateral 08/16/2019   Procedure: XI ROBOTIC ASSISTED LAPAROSCOPIC HYSTERECTOMY WITH BILATERAL SALPINGECTOMY AND OOPHERECTOMY;  Surgeon: Delsa Bern, MD;  Location: [facility];  Service: Gynecology;  Laterality: Bilateral;  . TOE SURGERY  '90s    Current Outpatient Medications  Medication Sig Dispense Refill  . Cholecalciferol (VITAMIN D) 125 MCG (5000 UT) CAPS Take 5,000 Units by mouth daily.     . Cyanocobalamin (VITAMIN B 12 PO) Take by mouth.    . Multiple Vitamin (MULTIVITAMIN) tablet Take 1 tablet by mouth daily.    . Omega-3 1000 MG CAPS Take by mouth.    . Probiotic Product (PROBIOTIC DAILY PO) Take 1 capsule by mouth daily.    Marland Kitchen triamcinolone cream (KENALOG) 0.5 %      No current facility-administered medications for this visit.    Allergies as of 08/02/2020 - Review Complete 08/02/2020  Allergen Reaction Noted  . Other  05/09/2020  . Oxycodone  09/29/2019  . Shellfish allergy Itching, Swelling, and Rash 05/19/2016    Family History  Problem Relation Age of Onset  . Diabetes Mother   . Hypertension Mother   . Colon cancer Brother   . Cancer Brother  colon at 70  . Hypertension Sister   . Hypertension Sister   . Hypertension Sister   . Cancer Sister        Ovarian Cancer   . Esophageal cancer Neg Hx   . Stomach cancer Neg Hx     Social History   Socioeconomic History  . Marital status: Married    Spouse name: Not on file  . Number of children: 0  . Years of education: Not on file  . Highest education level: Not on file  Occupational History  . Occupation: Orthoptist: [employer]  Tobacco Use  . Smoking status: Never Smoker  . Smokeless tobacco: Never Used  Vaping Use  . Vaping Use: Never used  Substance and Sexual Activity  .  Alcohol use: Not Currently    Alcohol/week: 0.0 standard drinks    Comment: occaional  . Drug use: No  . Sexual activity: Not Currently    Partners: Male    Birth control/protection: None    Comment: perimenopausal   Other Topics Concern  . Not on file  Social History Narrative   ** Merged History Encounter **       Social Determinants of Health   Financial Resource Strain:   . Difficulty of Paying Living Expenses: Not on file  Food Insecurity:   . Worried About Charity fundraiser in the Last Year: Not on file  . Ran Out of Food in the Last Year: Not on file  Transportation Needs:   . Lack of Transportation (Medical): Not on file  . Lack of Transportation (Non-Medical): Not on file  Physical Activity:   . Days of Exercise per Week: Not on file  . Minutes of Exercise per Session: Not on file  Stress:   . Feeling of Stress : Not on file  Social Connections:   . Frequency of Communication with Friends and Family: Not on file  . Frequency of Social Gatherings with Friends and Family: Not on file  . Attends Religious Services: Not on file  . Active Member of Clubs or Organizations: Not on file  . Attends Archivist Meetings: Not on file  . Marital Status: Not on file  Intimate Partner Violence:   . Fear of Current or Ex-Partner: Not on file  . Emotionally Abused: Not on file  . Physically Abused: Not on file  . Sexually Abused: Not on file     Physical Exam: Wt 157 lb (71.2 kg)   LMP 07/14/2012   BMI 24.59 kg/m  Constitutional: generally well-appearing Psychiatric: alert and oriented x3 Eyes: extraocular movements intact Mouth: oral pharynx moist, no lesions Neck: supple no lymphadenopathy Cardiovascular: heart regular rate and rhythm Lungs: clear to auscultation bilaterally Abdomen: soft, nontender, nondistended, no obvious ascites, no peritoneal signs, normal bowel sounds Extremities: no lower extremity edema bilaterally Skin: no lesions on visible  extremities   Assessment and plan: 59 y.o. female with slightly elevated liver enzymes, fatty liver on ultrasound  He is very likely that her slightly elevated liver transaminases were from mild fatty liver disease.  She had gained quite a bit of weight before those labs were checked.  Since then she has lost most of that weight, 20 pounds in the past 3 months to be exact.  She had complete metabolic profile checked this morning at her primary care office and we will tract down those results when they are available.  If her liver tests have normalized and  I do not think she needs any other testing.  If they are still elevated then probably she will need routine work-up for elevated liver tests.  I will contact her when I see the results.  She also had some questions about the incidental gallstones in her gallbladder.  It does not seem like they are causing her any symptoms on review.  I recommended that nothing be done about them because they do not seem to be causing her any troubles.  Please see the "Patient Instructions" section for addition details about the plan.   Owens Loffler, MD [facility] 08/02/2020, 2:06 PM  Cc: Alycia Rossetti, MD  Total time on date of encounter was 31 minutes (this included time spent preparing to see the patient reviewing records; obtaining and/or reviewing separately obtained history; performing a medically appropriate exam and/or evaluation; counseling and educating the patient and family if present; ordering medications, tests or procedures if applicable; and documenting clinical information in the health record).

## 2020-08-02 NOTE — Patient Instructions (Signed)
If you are age 59 or older, your body mass index should be between 23-30. Your Body mass index is 24.59 kg/m. If this is out of the aforementioned range listed, please consider follow up with your Primary Care Provider.  If you are age 18 or younger, your body mass index should be between 19-25. Your Body mass index is 24.59 kg/m. If this is out of the aformentioned range listed, please consider follow up with your Primary Care Provider.   Your follow up will depend on lab results drawn by PCP.  We will review lab results and contact you with further follow up care.  Thank you for entrusting me with your care and choosing [facility].  Dr Ardis Hughs

## 2020-08-05 ENCOUNTER — Telehealth: Payer: Self-pay

## 2020-08-05 ENCOUNTER — Encounter: Payer: Self-pay | Admitting: Family Medicine

## 2020-08-05 DIAGNOSIS — R748 Abnormal levels of other serum enzymes: Secondary | ICD-10-CM

## 2020-08-05 NOTE — Telephone Encounter (Signed)
Patient aware that Dr Ardis Hughs' has reviewed recent lab work done at PCP.  Patient advised that liver tests were normal.  Patient commended on weight loss and advised to continue with weight loss.  Patient will recheck lab work in 6 months (Feb 2022).  Patient agreed to plan and verbalized understanding.  No further questions.

## 2020-08-05 NOTE — Telephone Encounter (Signed)
-----   Message from Milus Banister, MD sent at 08/05/2020 11:08 AM EDT ----- Regarding: RE: CMET restults Thanks, can you please call her.  Her liver tests were all completely normal.  Almost certainly this is because she lost weight recently.  Please commended her on her weight loss.  Lets get a repeat set of LFTs in 6 months.  Thank you ----- Message ----- From: Stevan Born, CMA Sent: 08/05/2020  11:06 AM EDT To: Milus Banister, MD Subject: CMET restults                                  CMET results are in from PCP.  Thank you

## 2020-08-06 DIAGNOSIS — L910 Hypertrophic scar: Secondary | ICD-10-CM | POA: Diagnosis not present

## 2020-08-06 MED FILL — TRIAMCINOLONE ACETONIDE 40: 40 | 1 days supply | Qty: 1 | Fill #0

## 2020-08-13 DIAGNOSIS — M7532 Calcific tendinitis of left shoulder: Secondary | ICD-10-CM | POA: Diagnosis not present

## 2020-08-13 DIAGNOSIS — M542 Cervicalgia: Secondary | ICD-10-CM | POA: Diagnosis not present

## 2020-08-13 MED FILL — CELECOXIB 100 MG CAP: 100 | 30 days supply | Qty: 60 | Fill #0

## 2020-08-14 DIAGNOSIS — L910 Hypertrophic scar: Secondary | ICD-10-CM | POA: Diagnosis not present

## 2020-09-12 MED FILL — TRIAMCINOLONE ACETONIDE 40: 40 | 30 days supply | Qty: 1 | Fill #0

## 2020-09-19 DIAGNOSIS — L910 Hypertrophic scar: Secondary | ICD-10-CM | POA: Diagnosis not present

## 2020-12-25 HISTORY — PX: TOOTH EXTRACTION: SUR596

## 2020-12-26 ENCOUNTER — Telehealth: Payer: Self-pay

## 2020-12-26 NOTE — Telephone Encounter (Signed)
Attempted to reach patient by phone to remind her that she will to have labs drawn in February, but there was no answer.  Voicemail box was full and could not receive messages at this time.  Will continue efforts.  

## 2020-12-26 NOTE — Telephone Encounter (Signed)
-----   Message from Stevan Born, [place] sent at 08/05/2020 11:40 AM EDT ----- Regarding: hepatic functions Patient needs to have hepatic functions drawn in Feb 2022 per Dr Ardis Hughs (orders are in)

## 2021-01-01 NOTE — Telephone Encounter (Signed)
Attempted to reach patient by phone to remind her that she will to have labs drawn in February, but there was no answer.  Voicemail box was full and could not receive messages at this time.  Will continue efforts.

## 2021-01-03 MED FILL — SHINGRIX 50 MCG SUS: 50 | 1 days supply | Qty: 1 | Fill #0

## 2021-01-06 NOTE — Telephone Encounter (Signed)
Inbound call from patient returning missed call.  Informed her of labs needing to be drawn in February.

## 2021-01-15 ENCOUNTER — Other Ambulatory Visit (HOSPITAL_COMMUNITY): Payer: Self-pay | Admitting: Obstetrics and Gynecology

## 2021-01-15 MED FILL — TRIAMCINOLONE ACETONIDE 40: 40 | 30 days supply | Qty: 1 | Fill #0

## 2021-01-20 ENCOUNTER — Ambulatory Visit: Payer: 59 | Admitting: Physician Assistant

## 2021-01-20 DIAGNOSIS — L910 Hypertrophic scar: Secondary | ICD-10-CM | POA: Diagnosis not present

## 2021-02-17 ENCOUNTER — Other Ambulatory Visit (HOSPITAL_COMMUNITY): Payer: Self-pay | Admitting: Obstetrics and Gynecology

## 2021-02-17 DIAGNOSIS — L910 Hypertrophic scar: Secondary | ICD-10-CM | POA: Diagnosis not present

## 2021-02-17 MED FILL — TRIAMCINOLONE ACETONIDE 40: 40 | 30 days supply | Qty: 1 | Fill #0

## 2021-03-24 ENCOUNTER — Other Ambulatory Visit (HOSPITAL_COMMUNITY): Payer: Self-pay

## 2021-03-24 DIAGNOSIS — L910 Hypertrophic scar: Secondary | ICD-10-CM | POA: Diagnosis not present

## 2021-03-24 MED FILL — Zoster Vac Recombinant Adjuvanted for IM Inj 50 MCG/0.5ML: INTRAMUSCULAR | 1 days supply | Qty: 1 | Fill #0 | Status: CN

## 2021-03-27 ENCOUNTER — Other Ambulatory Visit (HOSPITAL_COMMUNITY): Payer: Self-pay

## 2021-03-27 MED ORDER — IBUPROFEN 600 MG PO TABS
600.0000 mg | ORAL_TABLET | ORAL | 0 refills | Status: DC
  Filled 2021-03-27: qty 20, 5d supply, fill #0

## 2021-03-27 MED ORDER — AMOXICILLIN 875 MG PO TABS
875.0000 mg | ORAL_TABLET | Freq: Two times a day (BID) | ORAL | 0 refills | Status: DC
  Filled 2021-03-27: qty 14, 7d supply, fill #0

## 2021-04-01 ENCOUNTER — Other Ambulatory Visit (HOSPITAL_COMMUNITY): Payer: Self-pay

## 2021-04-04 ENCOUNTER — Other Ambulatory Visit (HOSPITAL_COMMUNITY): Payer: Self-pay

## 2021-04-14 ENCOUNTER — Other Ambulatory Visit (HOSPITAL_COMMUNITY): Payer: Self-pay

## 2021-04-14 MED FILL — Zoster Vac Recombinant Adjuvanted for IM Inj 50 MCG/0.5ML: INTRAMUSCULAR | 30 days supply | Qty: 1 | Fill #0 | Status: AC

## 2021-04-15 ENCOUNTER — Other Ambulatory Visit (HOSPITAL_COMMUNITY): Payer: Self-pay

## 2021-05-14 DIAGNOSIS — E559 Vitamin D deficiency, unspecified: Secondary | ICD-10-CM | POA: Diagnosis not present

## 2021-05-14 DIAGNOSIS — E785 Hyperlipidemia, unspecified: Secondary | ICD-10-CM | POA: Diagnosis not present

## 2021-05-14 DIAGNOSIS — Z0000 Encounter for general adult medical examination without abnormal findings: Secondary | ICD-10-CM | POA: Diagnosis not present

## 2021-05-14 DIAGNOSIS — Z1389 Encounter for screening for other disorder: Secondary | ICD-10-CM | POA: Diagnosis not present

## 2021-05-26 ENCOUNTER — Other Ambulatory Visit (HOSPITAL_BASED_OUTPATIENT_CLINIC_OR_DEPARTMENT_OTHER): Payer: Self-pay

## 2021-05-26 ENCOUNTER — Other Ambulatory Visit: Payer: Self-pay

## 2021-05-26 ENCOUNTER — Ambulatory Visit: Payer: 59 | Attending: Internal Medicine

## 2021-05-26 DIAGNOSIS — Z23 Encounter for immunization: Secondary | ICD-10-CM

## 2021-05-26 MED ORDER — PFIZER-BIONT COVID-19 VAC-TRIS 30 MCG/0.3ML IM SUSP
INTRAMUSCULAR | 0 refills | Status: DC
  Filled 2021-05-26: qty 0.3, 1d supply, fill #0

## 2021-05-26 NOTE — Progress Notes (Signed)
   Covid-19 Vaccination Clinic  Name:  Megan Miles    MRN: [id number] DOB: [birth year]  05/26/2021  [name] was observed post Covid-19 immunization for 15 minutes without incident. She was provided with Vaccine Information Sheet and instruction to access the V-Safe system.   [name] was instructed to call 911 with any severe reactions post vaccine: Difficulty breathing  Swelling of face and throat  A fast heartbeat  A bad rash all over body  Dizziness and weakness   Immunizations Administered     Name Date Dose VIS Date Route   PFIZER Comrnaty(Gray TOP) Covid-19 Vaccine 05/26/2021  2:02 PM 0.3 mL 11/21/2020 Intramuscular   Manufacturer: [place]   Lot: [id number]   [place]: [phone]

## 2021-06-04 ENCOUNTER — Other Ambulatory Visit (HOSPITAL_COMMUNITY): Payer: Self-pay

## 2021-06-18 DIAGNOSIS — Z1231 Encounter for screening mammogram for malignant neoplasm of breast: Secondary | ICD-10-CM | POA: Diagnosis not present

## 2021-06-18 DIAGNOSIS — Z01419 Encounter for gynecological examination (general) (routine) without abnormal findings: Secondary | ICD-10-CM | POA: Diagnosis not present

## 2021-06-18 DIAGNOSIS — Z1211 Encounter for screening for malignant neoplasm of colon: Secondary | ICD-10-CM | POA: Diagnosis not present

## 2021-06-18 DIAGNOSIS — Z6826 Body mass index (BMI) 26.0-26.9, adult: Secondary | ICD-10-CM | POA: Diagnosis not present

## 2021-08-14 DIAGNOSIS — E7800 Pure hypercholesterolemia, unspecified: Secondary | ICD-10-CM | POA: Diagnosis not present

## 2021-09-25 DIAGNOSIS — M7712 Lateral epicondylitis, left elbow: Secondary | ICD-10-CM | POA: Diagnosis not present

## 2021-09-25 DIAGNOSIS — M6740 Ganglion, unspecified site: Secondary | ICD-10-CM | POA: Diagnosis not present

## 2021-09-29 ENCOUNTER — Telehealth: Payer: Self-pay | Admitting: Gastroenterology

## 2021-09-29 NOTE — Telephone Encounter (Signed)
Patient seeking advise having a lot of upper GI symptoms.

## 2021-09-29 NOTE — Telephone Encounter (Signed)
The pt is calling with complaints of LLQ pain (has had in the past) and worse reflux.  She is not taking any PPI at this time.  She does say she will try OTC PPI.  She states she is having regular BMs.  She has been scheduled to see Dr Ardis Hughs on 11/17.  She will see her PCP in the meantime if she does not feel that she can wait.  She will call back if her symptoms worsen.

## 2021-10-20 ENCOUNTER — Ambulatory Visit (INDEPENDENT_AMBULATORY_CARE_PROVIDER_SITE_OTHER): Payer: 59 | Admitting: Gastroenterology

## 2021-10-20 ENCOUNTER — Encounter: Payer: Self-pay | Admitting: Gastroenterology

## 2021-10-20 DIAGNOSIS — R109 Unspecified abdominal pain: Secondary | ICD-10-CM | POA: Diagnosis not present

## 2021-10-20 DIAGNOSIS — K219 Gastro-esophageal reflux disease without esophagitis: Secondary | ICD-10-CM | POA: Diagnosis not present

## 2021-10-20 DIAGNOSIS — R1310 Dysphagia, unspecified: Secondary | ICD-10-CM

## 2021-10-20 NOTE — Progress Notes (Signed)
Review of pertinent gastrointestinal problems: 1. FH of colon cancer: brother died in his 60s from colon cancer.  Colonoscopy 03/2008 Dr. Ardis Hughs was normal.    Colonoscopy November 2018 found 2 subcentimeter adenomas, hemorrhoids. 2.  Elevated liver tests.  Transaminases slightly elevated May 2021 AST was 39 and ALT was 68.  CBC was normal.  Hepatitis C antibody April 2020 was negative.  Abdominal ultrasound June 2021 suggestive of fatty liver.  Repeat liver tests August 2021 were all completely normal.  She was recommended to have repeat liver tests in 6 months but never had them done. 3.  GERD without alarm symptoms evaluated August 2020.  Proton pump inhibitor was very helpful. 4.  Left-sided abdominal pain.  Evaluation at GI office August 2020.  Pains were felt likely related to left uterine fibroids based on imaging characteristics and history.   HPI: This is a pleasant 60 year old woman  Abdominal ultrasound right upper quadrant June 2021 showed multiple gallstones in her gallbladder.  The common bile duct was normal.  Her weight is up 16 pounds since her last office visit here August 2021  Last saw her a little over a year ago.  Today she is here to discuss some residual left-sided abdominal discomforts.  She had significant left-sided abdominal pains until her hysterectomy in 2020.  Her symptoms are much much improved since then however she still did have some minor left-sided gas type pains that would bother her intermittently on the left side.  She decided to stop taking a variety of chemicals which she was on including probiotics, omega-3 fatty acids, multivitamins and several others.  Since stopping those medicines the left-sided abdominal pains have completely resolved.  She told me she would like to have an upper endoscopy.  She sleeps sitting up every day because of indigestion burning and what sounds like reflux.  She absolutely does not want to try any antiacid medicines.  She is  completely against any prescription type chemicals she tells me.  She does have dysphagia at times to solid foods.   ROS: complete GI ROS as described in HPI, all other review negative.  Constitutional:  No unintentional weight loss   Past Medical History:  Diagnosis Date   Anemia    low iron  border line as a child   Anxiety    Phreesia 05/09/2020   Depression    Phreesia 05/09/2020   GERD (gastroesophageal reflux disease)    hx of no meds now   Hyperlipidemia 06/21/2017   Ovarian cyst    Vitamin D deficiency 06/21/2017    Past Surgical History:  Procedure Laterality Date   ABDOMINAL HYSTERECTOMY N/A    Phreesia 05/09/2020   COLONOSCOPY     ESOPHAGOGASTRODUODENOSCOPY     LIPOSUCTION  '90s   ROBOTIC ASSISTED LAPAROSCOPIC HYSTERECTOMY AND SALPINGECTOMY Bilateral 08/16/2019   Procedure: XI ROBOTIC ASSISTED LAPAROSCOPIC HYSTERECTOMY WITH BILATERAL SALPINGECTOMY AND OOPHERECTOMY;  Surgeon: Delsa Bern, MD;  Location: [facility];  Service: Gynecology;  Laterality: Bilateral;   TOE SURGERY  '90s    Current Outpatient Medications  Medication Sig Dispense Refill   COVID-19 mRNA Vac-TriS, Pfizer, (PFIZER-BIONT COVID-19 VAC-TRIS) SUSP injection Inject into the muscle. 0.3 mL 0   No current facility-administered medications for this visit.    Allergies as of 10/20/2021 - Review Complete 10/20/2021  Allergen Reaction Noted   Other  05/09/2020   Oxycodone  09/29/2019   Shellfish allergy Itching, Swelling, and Rash 05/19/2016    Family History  Problem Relation  Age of Onset   Diabetes Mother    Hypertension Mother    Colon cancer Brother    Cancer Brother        colon at 69   Hypertension Sister    Hypertension Sister    Hypertension Sister    Cancer Sister        Ovarian Cancer    Esophageal cancer Neg Hx    Stomach cancer Neg Hx     Social History   Socioeconomic History   Marital status: Married    Spouse name: Not on file   Number of children:  0   Years of education: Not on file   Highest education level: Not on file  Occupational History   Occupation: Orthoptist: [employer]  Tobacco Use   Smoking status: Never   Smokeless tobacco: Never  Vaping Use   Vaping Use: Never used  Substance and Sexual Activity   Alcohol use: Not Currently    Alcohol/week: 0.0 standard drinks    Comment: occaional   Drug use: No   Sexual activity: Not Currently    Partners: Male    Birth control/protection: None    Comment: perimenopausal   Other Topics Concern   Not on file  Social History Narrative   ** Merged History Encounter **       Social Determinants of Health   Financial Resource Strain: Not on file  Food Insecurity: Not on file  Transportation Needs: Not on file  Physical Activity: Not on file  Stress: Not on file  Social Connections: Not on file  Intimate Partner Violence: Not on file     Physical Exam: Ht 5\' 6"  (1.676 m)   Wt 173 lb 9.6 oz (78.7 kg)   LMP 07/14/2012   BMI 28.02 kg/m  Constitutional: generally well-appearing Psychiatric: alert and oriented x3 Abdomen: soft, nontender, nondistended, no obvious ascites, no peritoneal signs, normal bowel sounds No peripheral edema noted in lower extremities  Assessment and plan: 60 y.o. female with completely resolved left-sided abdominal pains. intermittent dysphagia and GERD  First I am glad that her left-sided abdominal pains have completely resolved since she stopped taking probiotics, omega 3 fatty acids and multivitamins.  Obviously her pains must of been somehow related to those chemicals.  Second it sounds like she has GERD and probably GERD related dysphagia.  She sleeps sitting up.  I do not think her dysphagia represents anything sinister, especially since she has gained 16 pounds in the past year since her visit here.  I recommended EGD to make sure that is indeed the case.  I recommended a trial of antiacid medicines between now and  then however she is absolutely against taking these type of medicines.  Please see the "Patient Instructions" section for addition details about the plan.  Owens Loffler, MD [facility] 10/20/2021, 1:40 PM   Total time on date of encounter was 35 minutes (this included time spent preparing to see the patient reviewing records; obtaining and/or reviewing separately obtained history; performing a medically appropriate exam and/or evaluation; counseling and educating the patient and family if present; ordering medications, tests or procedures if applicable; and documenting clinical information in the health record).

## 2021-10-20 NOTE — Patient Instructions (Addendum)
If you are age 60 or younger, your body mass index should be between 19-25. Your Body mass index is 28.02 kg/m. If this is out of the aformentioned range listed, please consider follow up with your Primary Care Provider.  ________________________________________________________  The [facility] providers would like to encourage you to use [facility] to communicate with providers for non-urgent requests or questions.  Due to long hold times on the telephone, sending your provider a message by [facility] may be a faster and more efficient way to get a response.  Please allow 48 business hours for a response.  Please remember that this is for non-urgent requests.  _______________________________________________________  Megan Miles have been scheduled for an endoscopy. Please follow written instructions given to you at your visit today. If you use inhalers (even only as needed), please bring them with you on the day of your procedure.  Due to recent changes in healthcare laws, you may see the results of your imaging and laboratory studies on MyChart before your provider has had a chance to review them.  We understand that in some cases there may be results that are confusing or concerning to you. Not all laboratory results come back in the same time frame and the provider may be waiting for multiple results in order to interpret others.  Please give [place] 48 hours in order for your provider to thoroughly review all the results before contacting the office for clarification of your results.   Thank you for entrusting me with your care and choosing [facility].  Dr Ardis Hughs

## 2021-10-21 ENCOUNTER — Encounter: Payer: Self-pay | Admitting: Gastroenterology

## 2021-10-21 ENCOUNTER — Ambulatory Visit (AMBULATORY_SURGERY_CENTER): Payer: 59 | Admitting: Gastroenterology

## 2021-10-21 ENCOUNTER — Other Ambulatory Visit: Payer: Self-pay

## 2021-10-21 VITALS — BP 123/85 | HR 65 | Temp 98.4°F | Resp 33 | Ht 66.0 in | Wt 173.0 lb

## 2021-10-21 DIAGNOSIS — K219 Gastro-esophageal reflux disease without esophagitis: Secondary | ICD-10-CM | POA: Diagnosis not present

## 2021-10-21 DIAGNOSIS — K2100 Gastro-esophageal reflux disease with esophagitis, without bleeding: Secondary | ICD-10-CM | POA: Diagnosis not present

## 2021-10-21 DIAGNOSIS — K209 Esophagitis, unspecified without bleeding: Secondary | ICD-10-CM | POA: Diagnosis not present

## 2021-10-21 DIAGNOSIS — K2970 Gastritis, unspecified, without bleeding: Secondary | ICD-10-CM | POA: Diagnosis not present

## 2021-10-21 DIAGNOSIS — R1310 Dysphagia, unspecified: Secondary | ICD-10-CM

## 2021-10-21 MED ORDER — SODIUM CHLORIDE 0.9 % IV SOLN: 500.0000 mL | Freq: Once | INTRAVENOUS | Status: DC

## 2021-10-21 NOTE — Patient Instructions (Signed)
YOU HAD AN ENDOSCOPIC PROCEDURE TODAY AT THE [facility]:   Refer to the procedure report that was given to you for any specific questions about what was found during the examination.  If the procedure report does not answer your questions, please call your gastroenterologist to clarify.  If you requested that your care partner not be given the details of your procedure findings, then the procedure report has been included in a sealed envelope for you to review at your convenience later.  YOU SHOULD EXPECT: Some feelings of bloating in the abdomen. Passage of more gas than usual.  Walking can help get rid of the air that was put into your GI tract during the procedure and reduce the bloating. If you had a lower endoscopy (such as a colonoscopy or flexible sigmoidoscopy) you may notice spotting of blood in your stool or on the toilet paper. If you underwent a bowel prep for your procedure, you may not have a normal bowel movement for a few days.  Please Note:  You might notice some irritation and congestion in your nose or some drainage.  This is from the oxygen used during your procedure.  There is no need for concern and it should clear up in a day or so.  SYMPTOMS TO REPORT IMMEDIATELY:    Following upper endoscopy (EGD)  Vomiting of blood or coffee ground material  New chest pain or pain under the shoulder blades  Painful or persistently difficult swallowing  New shortness of breath  Fever of 100F or higher  Black, tarry-looking stools  For urgent or emergent issues, a gastroenterologist can be reached at any hour by calling [phone]. Do not use MyChart messaging for urgent concerns.    DIET:  We do recommend a small meal at first, but then you may proceed to your regular diet.  Drink plenty of fluids but you should avoid alcoholic beverages for 24 hours.  ACTIVITY:  You should plan to take it easy for the rest of today and you should NOT DRIVE or use heavy machinery  until tomorrow (because of the sedation medicines used during the test).    FOLLOW UP: Our staff will call the number listed on your records 48-72 hours following your procedure to check on you and address any questions or concerns that you may have regarding the information given to you following your procedure. If we do not reach you, we will leave a message.  We will attempt to reach you two times.  During this call, we will ask if you have developed any symptoms of COVID 19. If you develop any symptoms (ie: fever, flu-like symptoms, shortness of breath, cough etc.) before then, please call [phone].  If you test positive for Covid 19 in the 2 weeks post procedure, please call and report this information to us.    If any biopsies were taken you will be contacted by phone or by letter within the next 1-3 weeks.  Please call us at [phone] if you have not heard about the biopsies in 3 weeks.    SIGNATURES/CONFIDENTIALITY: You and/or your care partner have signed paperwork which will be entered into your electronic medical record.  These signatures attest to the fact that that the information above on your After Visit Summary has been reviewed and is understood.  Full responsibility of the confidentiality of this discharge information lies with you and/or your care-partner. 

## 2021-10-21 NOTE — Progress Notes (Signed)
  The recent H&P (dated yesterday) was reviewed, the patient was examined and there is no change in the patients condition since that H&P was completed.   Megan Miles  10/21/2021, 2:54 PM

## 2021-10-21 NOTE — Op Note (Signed)
[place] Patient Name: Megan Miles Procedure Date: 10/21/2021 2:58 PM MRN: [id number] Endoscopist: Milus Banister , MD Age: 60 Referring MD:  Date of Birth: [birth year] Gender: Female Account #: [id number] Procedure:                Upper GI endoscopy Indications:              Dysphagia, Heartburn Medicines:                Monitored Anesthesia Care Procedure:                Pre-Anesthesia Assessment:                           - Prior to the procedure, a History and Physical                            was performed, and patient medications and                            allergies were reviewed. The patient's tolerance of                            previous anesthesia was also reviewed. The risks                            and benefits of the procedure and the sedation                            options and risks were discussed with the patient.                            All questions were answered, and informed consent                            was obtained. Prior Anticoagulants: The patient has                            taken no previous anticoagulant or antiplatelet                            agents. ASA Grade Assessment: II - A patient with                            mild systemic disease. After reviewing the risks                            and benefits, the patient was deemed in                            satisfactory condition to undergo the procedure.                           After obtaining informed consent, the endoscope was  passed under direct vision. Throughout the                            procedure, the patient's blood pressure, pulse, and                            oxygen saturations were monitored continuously. The                            Endoscope was introduced through the mouth, and                            advanced to the second part of duodenum. The upper                            GI endoscopy was accomplished  without difficulty.                            The patient tolerated the procedure well. Scope In: Scope Out: Findings:                 The UGI tract was normal                           Biopsies were taken with a cold forceps in the                            proximal esophagus (jar 2) and in the distal                            esophagus (jar 1) for histology. Biopsies were                            taken with a cold forceps for histology. Complications:            No immediate complications. Estimated blood loss:                            None. Estimated Blood Loss:     Estimated blood loss: none. Impression:               - The examination was normal.                           - Biopsies were taken from the esophagus to check                            for eosinophilic esophagitis. Recommendation:           - Patient has a contact number available for                            emergencies. The signs and symptoms of potential                            delayed complications were discussed  with the                            patient. Return to normal activities tomorrow.                            Written discharge instructions were provided to the                            patient.                           - Resume previous diet.                           - Continue present medications. Consider a trial of                            pepcid (famotidine) 20mg  pills one pill at bedtime                            every night. This will probably help with your                            overnight acid/GERD problems.                           - Await pathology results. Milus Banister, MD 10/21/2021 3:21:45 PM This report has been signed electronically.

## 2021-10-21 NOTE — Progress Notes (Signed)
VS taken by C.W. 

## 2021-10-21 NOTE — Progress Notes (Signed)
Called to room to assist during endoscopic procedure.  Patient ID and intended procedure confirmed with present staff. Received instructions for my participation in the procedure from the performing physician.  

## 2021-10-21 NOTE — Progress Notes (Signed)
Pt in recovery with monitors in place, VSS. Report given to receiving RN. Bite guard was placed with pt awake to ensure comfort. No dental or soft tissue damage noted. 

## 2021-10-23 ENCOUNTER — Telehealth: Payer: Self-pay

## 2021-10-23 NOTE — Telephone Encounter (Signed)
  Follow up Call-  Call back number 10/21/2021  Post procedure Call Back phone  # [phone]  Permission to leave phone message Yes  Some recent data might be hidden     Patient questions:  Do you have a fever, pain , or abdominal swelling? No. Pain Score  0 *  Have you tolerated food without any problems? Yes.    Have you been able to return to your normal activities? Yes.    Do you have any questions about your discharge instructions: Diet   No. Medications  No. Follow up visit  No.  Do you have questions or concerns about your Care? No.  Actions: * If pain score is 4 or above: No action needed, pain <4.  Have you developed a fever since your procedure? no  2.   Have you had an respiratory symptoms (SOB or cough) since your procedure? no  3.   Have you tested positive for COVID 19 since your procedure no  4.   Have you had any family members/close contacts diagnosed with the COVID 19 since your procedure?  no   If yes to any of these questions please route to Joylene John, RN and Joella Prince, RN

## 2021-10-27 ENCOUNTER — Encounter: Payer: Self-pay | Admitting: Gastroenterology

## 2021-11-19 ENCOUNTER — Telehealth: Payer: Self-pay | Admitting: Gastroenterology

## 2021-11-19 NOTE — Telephone Encounter (Signed)
Patient called stating she was still having the right-sided abdominal pain she has been discussing with you.  She wants to have her colonoscopy this year asap and does not want to wait until 11/23 when her recall shows she's due.  Please call patient and let her know what can be done for her.  Thank you.

## 2021-11-19 NOTE — Telephone Encounter (Signed)
The pt has lower left side abd pain that radiates to the back.  States she has been having the same symptoms since 2020.  She is asking for a colonoscopy as soon as possible.  She states she is not due until 11/23.  She was last seen for EGD on 11/8 and recommended she take Pepcid at bedtime but she is not currently taking.   She is adamant that she needs to have a colonoscopy this year.  Please advise

## 2021-11-19 NOTE — Telephone Encounter (Signed)
The pt has been advised that Dr Ardis Hughs can not offer colon this year.  I tried to set up a follow up appt for her to discuss and she states that she will find another practice to go to.  FYI

## 2021-12-03 NOTE — Telephone Encounter (Signed)
Patient called to see if she could get her colon done this year, I advised patient that she needed to come in for a OV to discuss with Dr. Ardis Hughs. Patient agreed that was okay.   Patient was scheduled for 2/8 at 3:40

## 2022-01-12 DIAGNOSIS — N644 Mastodynia: Secondary | ICD-10-CM | POA: Diagnosis not present

## 2022-01-21 ENCOUNTER — Other Ambulatory Visit (HOSPITAL_COMMUNITY): Payer: Self-pay

## 2022-01-21 ENCOUNTER — Encounter: Payer: Self-pay | Admitting: Gastroenterology

## 2022-01-21 ENCOUNTER — Ambulatory Visit (INDEPENDENT_AMBULATORY_CARE_PROVIDER_SITE_OTHER): Payer: 59 | Admitting: Gastroenterology

## 2022-01-21 DIAGNOSIS — R109 Unspecified abdominal pain: Secondary | ICD-10-CM | POA: Diagnosis not present

## 2022-01-21 DIAGNOSIS — Z800 Family history of malignant neoplasm of digestive organs: Secondary | ICD-10-CM | POA: Diagnosis not present

## 2022-01-21 MED ORDER — NA SULFATE-K SULFATE-MG SULF 17.5-3.13-1.6 GM/177ML PO SOLN
1.0000 | ORAL | 0 refills | Status: DC
  Filled 2022-01-21: qty 354, 1d supply, fill #0

## 2022-01-21 MED ORDER — NA SULFATE-K SULFATE-MG SULF 17.5-3.13-1.6 GM/177ML PO SOLN
1.0000 | ORAL | 0 refills | Status: DC
  Filled 2022-01-21: qty 324, 1d supply, fill #0

## 2022-01-21 NOTE — Patient Instructions (Signed)
If you are age 61 or younger, your body mass index should be between 19-25. Your Body mass index is 27.1 kg/m. If this is out of the aformentioned range listed, please consider follow up with your Primary Care Provider.  ________________________________________________________  The [facility] providers would like to encourage you to use [facility] to communicate with providers for non-urgent requests or questions.  Due to long hold times on the telephone, sending your provider a message by [facility] may be a faster and more efficient way to get a response.  Please allow 48 business hours for a response.  Please remember that this is for non-urgent requests.  _______________________________________________________  Megan Miles have been scheduled for a colonoscopy. Please follow written instructions given to you at your visit today.  Please pick up your prep supplies at the pharmacy within the next 1-3 days. If you use inhalers (even only as needed), please bring them with you on the day of your procedure.  Due to recent changes in healthcare laws, you may see the results of your imaging and laboratory studies on MyChart before your provider has had a chance to review them.  We understand that in some cases there may be results that are confusing or concerning to you. Not all laboratory results come back in the same time frame and the provider may be waiting for multiple results in order to interpret others.  Please give [place] 48 hours in order for your provider to thoroughly review all the results before contacting the office for clarification of your results.   Thank you for entrusting me with your care and choosing [facility].  Dr Ardis Hughs

## 2022-01-21 NOTE — Progress Notes (Signed)
Review of pertinent gastrointestinal problems: 1. FH of colon cancer: brother died in his 2s from colon cancer.  Colonoscopy 03/2008 Dr. Ardis Hughs was normal.    Colonoscopy November 2018 found 2 subcentimeter adenomas, hemorrhoids. 2.  Elevated liver tests.  Transaminases slightly elevated May 2021 AST was 39 and ALT was 68.  CBC was normal.  Hepatitis C antibody April 2020 was negative.  Abdominal ultrasound June 2021 suggestive of fatty liver.  Repeat liver tests August 2021 were all completely normal.  She was recommended to have repeat liver tests in 6 months but never had them done.   3.  GERD without alarm symptoms evaluated August 2020.  Proton pump inhibitor was very helpful. 4.  Left-sided abdominal pain.  Evaluation at GI office August 2020.  Pains were felt likely related to uterine fibroids based on imaging characteristics and history.  Hysterectomy September 2020 significantly improved her pains. 5.  Dysphagia evaluation led to EGD November 2022 the upper GI tract was normal.  Biopsies were taken from the esophagus to check for eosinophilic esophagitis and the biopsies suggested acid related esophagitis.  I recommend she can send her a trial of famotidine 20 mg at bedtime once nightly.    HPI: This is a pleasant 61 year old woman  I last saw her 3 months ago at the time of an upper endoscopy.  See those results summarized above.  This suggested some acid related irritation of her esophagus and I suggest that she try an over-the-counter antiacid medicine to help with her intermittent dysphagia symptoms.  She is here today to discuss her chronic left flank pain.  She would like to have a colonoscopy to make sure that there is nothing serious going on in her colon that could account for.  The pain is intermittent, some days it is not there at all.  The pain is a twisting, it is sometimes related to eating, sometimes when she moves her bowels the pain is better.  She does not see blood in her  stool.  Her weight is overall stable.  Her brother had colon cancer at a very young age  She tells me that her gynecologist has been asking her to have a colonoscopy.   ROS: complete GI ROS as described in HPI, all other review negative.  Constitutional:  No unintentional weight loss   Past Medical History:  Diagnosis Date   Anemia    low iron  border line as a child   Anxiety    Phreesia 05/09/2020   Depression    [employer]reesia 05/09/2020   GERD (gastroesophageal reflux disease)    hx of no meds now   Hyperlipidemia 06/21/2017   Ovarian cyst    Vitamin D deficiency 06/21/2017    Past Surgical History:  Procedure Laterality Date   COLONOSCOPY     ESOPHAGOGASTRODUODENOSCOPY     LIPOSUCTION  '90s   ROBOTIC ASSISTED LAPAROSCOPIC HYSTERECTOMY AND SALPINGECTOMY Bilateral 08/16/2019   Procedure: XI ROBOTIC ASSISTED LAPAROSCOPIC HYSTERECTOMY WITH BILATERAL SALPINGECTOMY AND OOPHERECTOMY;  Surgeon: Delsa Bern, MD;  Location: [facility];  Service: Gynecology;  Laterality: Bilateral;   TOE SURGERY  '90s   TOOTH EXTRACTION  12/25/2020   2 upper teeth with bone graft    No current outpatient medications on file.   No current facility-administered medications for this visit.    Allergies as of 01/21/2022 - Review Complete 01/21/2022  Allergen Reaction Noted   Other  05/09/2020   Oxycodone  09/29/2019   Shellfish allergy Itching, Swelling,  and Rash 05/19/2016    Family History  Problem Relation Age of Onset   Diabetes Mother    Hypertension Mother    Hypertension Sister    Hypertension Sister    Hypertension Sister    Cancer Sister        Ovarian Cancer    Colon cancer Brother    Cancer Brother        colon at 71   Esophageal cancer Neg Hx    Stomach cancer Neg Hx    Rectal cancer Neg Hx     Social History   Socioeconomic History   Marital status: Married    Spouse name: Not on file   Number of children: 0   Years of education: Not on file    Highest education level: Not on file  Occupational History   Occupation: Orthoptist: [employer]  Tobacco Use   Smoking status: Never   Smokeless tobacco: Never  Vaping Use   Vaping Use: Never used  Substance and Sexual Activity   Alcohol use: Yes    Comment: very rare   Drug use: No   Sexual activity: Not Currently    Partners: Male    Birth control/protection: None    Comment: perimenopausal   Other Topics Concern   Not on file  Social History Narrative   ** Merged History Encounter **       Social Determinants of Health   Financial Resource Strain: Not on file  Food Insecurity: Not on file  Transportation Needs: Not on file  Physical Activity: Not on file  Stress: Not on file  Social Connections: Not on file  Intimate Partner Violence: Not on file     Physical Exam: Ht 5\' 7"  (1.702 m)    Wt 173 lb (78.5 kg)    LMP 07/14/2012    BMI 27.10 kg/m  Constitutional: generally well-appearing Psychiatric: alert and oriented x3 Abdomen: soft, nontender, nondistended, no obvious ascites, no peritoneal signs, normal bowel sounds No peripheral edema noted in lower extremities  Assessment and plan: 61 y.o. female with left flank pain  I am not sure that this pain is related to her GI tract.  Her brother did have colon cancer at quite a young age and her last colonoscopy was November 2018, a little less than 5 years ago and so I think repeating her colonoscopy now is probably reasonable.  She tells me her gynecologist wants that done but I am not sure why exactly that is  I see no reason for blood tests or imaging studies prior to then.  Please see the "Patient Instructions" section for addition details about the plan.  Owens Loffler, MD [facility] 01/21/2022, 3:51 PM   Total time on date of encounter was 30 minutes (this included time spent preparing to see the patient reviewing records; obtaining and/or reviewing separately obtained  history; performing a medically appropriate exam and/or evaluation; counseling and educating the patient and family if present; ordering medications, tests or procedures if applicable; and documenting clinical information in the health record).   Marland Kitchen

## 2022-02-04 DIAGNOSIS — G2581 Restless legs syndrome: Secondary | ICD-10-CM | POA: Diagnosis not present

## 2022-02-04 DIAGNOSIS — R600 Localized edema: Secondary | ICD-10-CM | POA: Diagnosis not present

## 2022-02-04 DIAGNOSIS — I872 Venous insufficiency (chronic) (peripheral): Secondary | ICD-10-CM | POA: Diagnosis not present

## 2022-02-04 DIAGNOSIS — I83891 Varicose veins of right lower extremities with other complications: Secondary | ICD-10-CM | POA: Diagnosis not present

## 2022-02-05 ENCOUNTER — Telehealth: Payer: 59 | Admitting: Physician Assistant

## 2022-02-05 ENCOUNTER — Other Ambulatory Visit (HOSPITAL_COMMUNITY): Payer: Self-pay

## 2022-02-05 DIAGNOSIS — M545 Low back pain, unspecified: Secondary | ICD-10-CM | POA: Diagnosis not present

## 2022-02-05 MED ORDER — MELOXICAM 15 MG PO TABS
15.0000 mg | ORAL_TABLET | Freq: Every day | ORAL | 0 refills | Status: AC
  Filled 2022-02-05: qty 15, 15d supply, fill #0

## 2022-02-05 MED ORDER — CYCLOBENZAPRINE HCL 10 MG PO TABS
10.0000 mg | ORAL_TABLET | Freq: Three times a day (TID) | ORAL | 0 refills | Status: AC | PRN
  Filled 2022-02-05: qty 15, 5d supply, fill #0

## 2022-02-05 NOTE — Patient Instructions (Signed)
Megan Miles, thank you for joining Leeanne Rio, PA-C for today's virtual visit.  While this provider is not your primary care provider (PCP), if your PCP is located in our provider database this encounter information will be shared with them immediately following your visit.  Consent: (Patient) Megan Miles provided verbal consent for this virtual visit at the beginning of the encounter.  Current Medications:  Current Outpatient Medications:    Na Sulfate-K Sulfate-Mg Sulf (SUPREP BOWEL PREP KIT) 17.5-3.13-1.6 GM/177ML SOLN, Take 1 kit by mouth as directed., Disp: 354 mL, Rfl: 0   Medications ordered in this encounter:  No orders of the defined types were placed in this encounter.    *If you need refills on other medications prior to your next appointment, please contact your pharmacy*  Follow-Up: Call back or seek an in-person evaluation if the symptoms worsen or if the condition fails to improve as anticipated.  Other Instructions Please avoid heavy lifting or overexertion.  Apply heating pad to the lower back for 10-15 minutes, a few times per day.  Take the Meloxicam once daily with food. Tylenol for breakthrough pain. You can take the Cyclobenzaprine (Flexeril) three times daily as directed as long as staying at home. Do not take and drive. Once improving, start some gentle stretches as noted below. If not resolving or you note new or worsening symptoms despite treatment, please seek an in-person evaluation ASAP. DO NOT DELAY CARE.  Low Back Sprain or Strain Rehab Ask your health care provider which exercises are safe for you. Do exercises exactly as told by your health care provider and adjust them as directed. It is normal to feel mild stretching, pulling, tightness, or discomfort as you do these exercises. Stop right away if you feel sudden pain or your pain gets worse. Do not begin these exercises until told by your health care provider. Stretching and  range-of-motion exercises These exercises warm up your muscles and joints and improve the movement and flexibility of your back. These exercises also help to relieve pain, numbness, and tingling. Lumbar rotation  Lie on your back on a firm bed or the floor with your knees bent. Straighten your arms out to your sides so each arm forms a 90-degree angle (right angle) with a side of your body. Slowly move (rotate) both of your knees to one side of your body until you feel a stretch in your lower back (lumbar). Try not to let your shoulders lift off the floor. Hold this position for __________ seconds. Tense your abdominal muscles and slowly move your knees back to the starting position. Repeat this exercise on the other side of your body. Repeat __________ times. Complete this exercise __________ times a day. Single knee to chest  Lie on your back on a firm bed or the floor with both legs straight. Bend one of your knees. Use your hands to move your knee up toward your chest until you feel a gentle stretch in your lower back and buttock. Hold your leg in this position by holding on to the front of your knee. Keep your other leg as straight as possible. Hold this position for __________ seconds. Slowly return to the starting position. Repeat with your other leg. Repeat __________ times. Complete this exercise __________ times a day. Prone extension on elbows  Lie on your abdomen on a firm bed or the floor (prone position). Prop yourself up on your elbows. Use your arms to help lift your chest up until  you feel a gentle stretch in your abdomen and your lower back. This will place some of your body weight on your elbows. If this is uncomfortable, try stacking pillows under your chest. Your hips should stay down, against the surface that you are lying on. Keep your hip and back muscles relaxed. Hold this position for __________ seconds. Slowly relax your upper body and return to the starting  position. Repeat __________ times. Complete this exercise __________ times a day. Strengthening exercises These exercises build strength and endurance in your back. Endurance is the ability to use your muscles for a long time, even after they get tired. Pelvic tilt This exercise strengthens the muscles that lie deep in the abdomen. Lie on your back on a firm bed or the floor with your legs extended. Bend your knees so they are pointing toward the ceiling and your feet are flat on the floor. Tighten your lower abdominal muscles to press your lower back against the floor. This motion will tilt your pelvis so your tailbone points up toward the ceiling instead of pointing to your feet or the floor. To help with this exercise, you may place a small towel under your lower back and try to push your back into the towel. Hold this position for __________ seconds. Let your muscles relax completely before you repeat this exercise. Repeat __________ times. Complete this exercise __________ times a day. Alternating arm and leg raises  Get on your hands and knees on a firm surface. If you are on a hard floor, you may want to use padding, such as an exercise mat, to cushion your knees. Line up your arms and legs. Your hands should be directly below your shoulders, and your knees should be directly below your hips. Lift your left leg behind you. At the same time, raise your right arm and straighten it in front of you. Do not lift your leg higher than your hip. Do not lift your arm higher than your shoulder. Keep your abdominal and back muscles tight. Keep your hips facing the ground. Do not arch your back. Keep your balance carefully, and do not hold your breath. Hold this position for __________ seconds. Slowly return to the starting position. Repeat with your right leg and your left arm. Repeat __________ times. Complete this exercise __________ times a day. Abdominal set with straight leg raise  Lie  on your back on a firm bed or the floor. Bend one of your knees and keep your other leg straight. Tense your abdominal muscles and lift your straight leg up, 4-6 inches (10-15 cm) off the ground. Keep your abdominal muscles tight and hold this position for __________ seconds. Do not hold your breath. Do not arch your back. Keep it flat against the ground. Keep your abdominal muscles tense as you slowly lower your leg back to the starting position. Repeat with your other leg. Repeat __________ times. Complete this exercise __________ times a day. Single leg lower with bent knees Lie on your back on a firm bed or the floor. Tense your abdominal muscles and lift your feet off the floor, one foot at a time, so your knees and hips are bent in 90-degree angles (right angles). Your knees should be over your hips and your lower legs should be parallel to the floor. Keeping your abdominal muscles tense and your knee bent, slowly lower one of your legs so your toe touches the ground. Lift your leg back up to return to the starting position.  Do not hold your breath. Do not let your back arch. Keep your back flat against the ground. Repeat with your other leg. Repeat __________ times. Complete this exercise __________ times a day. Posture and body mechanics Good posture and healthy body mechanics can help to relieve stress in your body's tissues and joints. Body mechanics refers to the movements and positions of your body while you do your daily activities. Posture is part of body mechanics. Good posture means: Your spine is in its natural S-curve position (neutral). Your shoulders are pulled back slightly. Your head is not tipped forward (neutral). Follow these guidelines to improve your posture and body mechanics in your everyday activities. Standing  When standing, keep your spine neutral and your feet about hip-width apart. Keep a slight bend in your knees. Your ears, shoulders, and hips should  line up. When you do a task in which you stand in one place for a long time, place one foot up on a stable object that is 2-4 inches (5-10 cm) high, such as a footstool. This helps keep your spine neutral. Sitting  When sitting, keep your spine neutral and keep your feet flat on the floor. Use a footrest, if necessary, and keep your thighs parallel to the floor. Avoid rounding your shoulders, and avoid tilting your head forward. When working at a desk or a computer, keep your desk at a height where your hands are slightly lower than your elbows. Slide your chair under your desk so you are close enough to maintain good posture. When working at a computer, place your monitor at a height where you are looking straight ahead and you do not have to tilt your head forward or downward to look at the screen. Resting When lying down and resting, avoid positions that are most painful for you. If you have pain with activities such as sitting, bending, stooping, or squatting, lie in a position in which your body does not bend very much. For example, avoid curling up on your side with your arms and knees near your chest (fetal position). If you have pain with activities such as standing for a long time or reaching with your arms, lie with your spine in a neutral position and bend your knees slightly. Try the following positions: Lying on your side with a pillow between your knees. Lying on your back with a pillow under your knees. Lifting  When lifting objects, keep your feet at least shoulder-width apart and tighten your abdominal muscles. Bend your knees and hips and keep your spine neutral. It is important to lift using the strength of your legs, not your back. Do not lock your knees straight out. Always ask for help to lift heavy or awkward objects. This information is not intended to replace advice given to you by your health care provider. Make sure you discuss any questions you have with your health care  provider. Document Revised: 02/17/2021 Document Reviewed: 02/17/2021 Elsevier Patient Education  2022 [employer].     If you have been instructed to have an in-person evaluation today at a local Urgent Care facility, please use the link below. It will take you to a list of all of our available [facility], including address, phone number and hours of operation. Please do not delay care.  [facility]  If you or a family member do not have a primary care provider, use the link below to schedule a visit and establish care. When you choose  a [facility] primary care physician or advanced practice provider, you gain a long-term partner in health. Find a Primary Care Provider  Learn more about [facility] in-office and virtual care options: [facility]

## 2022-02-05 NOTE — Progress Notes (Signed)
Virtual Visit Consent   Megan Miles, you are scheduled for a virtual visit with a [facility] provider today.     Just as with appointments in the office, your consent must be obtained to participate.  Your consent will be active for this visit and any virtual visit you may have with one of our providers in the next 365 days.     If you have a MyChart account, a copy of this consent can be sent to you electronically.  All virtual visits are billed to your insurance company just like a traditional visit in the office.    As this is a virtual visit, video technology does not allow for your provider to perform a traditional examination.  This may limit your provider's ability to fully assess your condition.  If your provider identifies any concerns that need to be evaluated in person or the need to arrange testing (such as labs, EKG, etc.), we will make arrangements to do so.     Although advances in technology are sophisticated, we cannot ensure that it will always work on either your end or our end.  If the connection with a video visit is poor, the visit may have to be switched to a telephone visit.  With either a video or telephone visit, we are not always able to ensure that we have a secure connection.     I need to obtain your verbal consent now.   Are you willing to proceed with your visit today?    Megan Miles has provided verbal consent on 02/05/2022 for a virtual visit (video or telephone).   Leeanne Rio, [place]   Date: 02/05/2022 10:23 AM   Virtual Visit via Video Note   I, Leeanne Rio, connected with  Megan Miles  ([id number], [birth year]) on 02/05/22 at 10:30 AM EST by a video-enabled telemedicine application and verified that I am speaking with the correct person using two identifiers.  Location: Patient: Virtual Visit Location Patient: Home Provider: Virtual Visit Location Provider: Home Office   I discussed the limitations of evaluation and management by  telemedicine and the availability of in person appointments. The patient expressed understanding and agreed to proceed.    History of Present Illness: Megan Miles is a 61 y.o. who identifies as a female who was assigned female at birth, and is being seen today for back pain. Notes starting yesterday after standing quickly, noting sharp pain in lower back just above buttock bilaterally. Notes pain with movement and turing/rotating. Pain sometimes radiating down the back of her legs. Denies numbness. Denies saddle anesthesia or change to bowel or bladder habits. Had similar episode about a year ago. Denies history of back trauma. Has not taken anything for symptoms.   HPI: HPI  Problems:  Patient Active Problem List   Diagnosis Date Noted   Anemia 09/29/2019   Gastroesophageal reflux disease 08/09/2019   Hyperlipidemia 06/21/2017   Vitamin D deficiency 06/21/2017    Allergies:  Allergies  Allergen Reactions   Other     Chlorhexidine    Oxycodone     Other reaction(s): Headache   Shellfish Allergy Itching, Swelling and Rash   Medications:  Current Outpatient Medications:    cyclobenzaprine (FLEXERIL) 10 MG tablet, Take 1 tablet (10 mg total) by mouth 3 (three) times daily as needed for muscle spasms., Disp: 15 tablet, Rfl: 0   meloxicam (MOBIC) 15 MG tablet, Take 1 tablet (15 mg total) by mouth daily., Disp:  15 tablet, Rfl: 0  Observations/Objective: Patient is well-developed, well-nourished in no acute distress.  Resting comfortably in chair at home.  Head is normocephalic, atraumatic.  No labored breathing. Speech is clear and coherent with logical content.  Patient is alert and oriented at baseline.   Assessment and Plan: 1. Acute bilateral low back pain without sciatica - meloxicam (MOBIC) 15 MG tablet; Take 1 tablet (15 mg total) by mouth daily.  Dispense: 15 tablet; Refill: 0 - cyclobenzaprine (FLEXERIL) 10 MG tablet; Take 1 tablet (10 mg total) by mouth 3 (three) times  daily as needed for muscle spasms.  Dispense: 15 tablet; Refill: 0  No trauma. No alarm signs or symptoms. Supportive measures and OTC medications reviewed. Rx Mobic once daily with food. Flexeril per orders. Stretching exercises reviewed. Strict UC/ER precautions reviewed with patient.   Follow Up Instructions: I discussed the assessment and treatment plan with the patient. The patient was provided an opportunity to ask questions and all were answered. The patient agreed with the plan and demonstrated an understanding of the instructions.  A copy of instructions were sent to the patient via MyChart unless otherwise noted below.    The patient was advised to call back or seek an in-person evaluation if the symptoms worsen or if the condition fails to improve as anticipated.  Time:  I spent 10 minutes with the patient via telehealth technology discussing the above problems/concerns.    Leeanne Rio, PA-C

## 2022-02-10 ENCOUNTER — Other Ambulatory Visit (HOSPITAL_COMMUNITY): Payer: Self-pay

## 2022-02-10 ENCOUNTER — Other Ambulatory Visit: Payer: Self-pay | Admitting: Internal Medicine

## 2022-02-10 ENCOUNTER — Ambulatory Visit
Admission: RE | Admit: 2022-02-10 | Discharge: 2022-02-10 | Disposition: A | Discharge status: Home / Self Care | Payer: 59 | Source: Ambulatory Visit | Attending: Internal Medicine | Admitting: Internal Medicine

## 2022-02-10 DIAGNOSIS — M549 Dorsalgia, unspecified: Secondary | ICD-10-CM

## 2022-02-10 DIAGNOSIS — M5430 Sciatica, unspecified side: Secondary | ICD-10-CM | POA: Diagnosis not present

## 2022-02-10 DIAGNOSIS — M47817 Spondylosis without myelopathy or radiculopathy, lumbosacral region: Secondary | ICD-10-CM | POA: Diagnosis not present

## 2022-02-10 DIAGNOSIS — M4807 Spinal stenosis, lumbosacral region: Secondary | ICD-10-CM | POA: Diagnosis not present

## 2022-02-10 MED ORDER — TIZANIDINE HCL 4 MG PO TABS
4.0000 mg | ORAL_TABLET | Freq: Every evening | ORAL | 1 refills | Status: AC | PRN
  Filled 2022-02-10: qty 30, 30d supply, fill #0

## 2022-02-10 MED ORDER — PREDNISONE 10 MG (21) PO TBPK
ORAL_TABLET | ORAL | 0 refills | Status: DC
  Filled 2022-02-10: qty 21, 6d supply, fill #0

## 2022-02-18 ENCOUNTER — Other Ambulatory Visit (HOSPITAL_COMMUNITY): Payer: Self-pay

## 2022-02-20 ENCOUNTER — Encounter: Payer: Self-pay | Admitting: Gastroenterology

## 2022-02-24 ENCOUNTER — Telehealth: Payer: Self-pay | Admitting: Gastroenterology

## 2022-02-24 NOTE — Telephone Encounter (Signed)
Hi Dr. Ardis Hughs, pt came into the office stating that she forgot about her procedure that was scheduled for tomorrow. Therefore, she did not follow instructions and had her regular meals today. Pt has been rescheduled to 03/02/22 at 8:00am. Thank you ?

## 2022-02-25 ENCOUNTER — Encounter: Payer: 59 | Admitting: Gastroenterology

## 2022-03-02 ENCOUNTER — Encounter: Payer: Self-pay | Admitting: Gastroenterology

## 2022-03-02 ENCOUNTER — Ambulatory Visit (AMBULATORY_SURGERY_CENTER): Payer: 59 | Admitting: Gastroenterology

## 2022-03-02 ENCOUNTER — Other Ambulatory Visit: Payer: Self-pay | Admitting: Gastroenterology

## 2022-03-02 ENCOUNTER — Other Ambulatory Visit: Payer: Self-pay

## 2022-03-02 VITALS — BP 117/76 | HR 60 | Temp 96.9°F | Resp 14 | Ht 67.0 in | Wt 173.0 lb

## 2022-03-02 DIAGNOSIS — Z1211 Encounter for screening for malignant neoplasm of colon: Secondary | ICD-10-CM | POA: Diagnosis not present

## 2022-03-02 DIAGNOSIS — Z86010 Personal history of colonic polyps: Secondary | ICD-10-CM | POA: Diagnosis not present

## 2022-03-02 DIAGNOSIS — Z800 Family history of malignant neoplasm of digestive organs: Secondary | ICD-10-CM

## 2022-03-02 DIAGNOSIS — D124 Benign neoplasm of descending colon: Secondary | ICD-10-CM

## 2022-03-02 MED ORDER — SODIUM CHLORIDE 0.9 % IV SOLN: 500.0000 mL | INTRAVENOUS | Status: DC

## 2022-03-02 NOTE — Progress Notes (Signed)
Pt's states no medical or surgical changes since previsit or office visit. 

## 2022-03-02 NOTE — Progress Notes (Signed)
To pacu, VSS. Report to Rn.tb 

## 2022-03-02 NOTE — Progress Notes (Signed)
Called to room to assist during endoscopic procedure.  Patient ID and intended procedure confirmed with present staff. Received instructions for my participation in the procedure from the performing physician.  

## 2022-03-02 NOTE — Progress Notes (Signed)
HPI: ?This is a woman with FH CRC, chronic left sided abd pains ? ? ?ROS: complete GI ROS as described in HPI, all other review negative. ? ?Constitutional:  No unintentional weight loss ? ? ?Past Medical History:  ?Diagnosis Date  ? Anemia   ? low iron  border line as a child  ? Anxiety   ? Phreesia 05/09/2020  ? Depression   ? Phreesia 05/09/2020  ? GERD (gastroesophageal reflux disease)   ? hx of no meds now  ? Hyperlipidemia 06/21/2017  ? Ovarian cyst   ? Vitamin D deficiency 06/21/2017  ? ? ?Past Surgical History:  ?Procedure Laterality Date  ? COLONOSCOPY    ? ESOPHAGOGASTRODUODENOSCOPY    ? LIPOSUCTION  '90s  ? ROBOTIC ASSISTED LAPAROSCOPIC HYSTERECTOMY AND SALPINGECTOMY Bilateral 08/16/2019  ? Procedure: XI ROBOTIC ASSISTED LAPAROSCOPIC HYSTERECTOMY WITH BILATERAL SALPINGECTOMY AND OOPHERECTOMY;  Surgeon: Delsa Bern, MD;  Location: [facility];  Service: Gynecology;  Laterality: Bilateral;  ? TOE SURGERY  '90s  ? TOOTH EXTRACTION  12/25/2020  ? 2 upper teeth with bone graft  ? ? ?Current Outpatient Medications  ?Medication Sig Dispense Refill  ? cyclobenzaprine (FLEXERIL) 10 MG tablet Take 1 tablet (10 mg total) by mouth 3 (three) times daily as needed for muscle spasms. 15 tablet 0  ? meloxicam (MOBIC) 15 MG tablet Take 1 tablet (15 mg total) by mouth daily. 15 tablet 0  ? predniSONE (STERAPRED UNI-PAK 21 TAB) 10 MG (21) TBPK tablet Take as directed on package. 21 tablet 0  ? tiZANidine (ZANAFLEX) 4 MG tablet Take 1 tablet (4 mg total) by mouth at bedtime as needed. 30 tablet 1  ? ?No current facility-administered medications for this visit.  ? ? ?Allergies as of 03/02/2022 - Review Complete 03/02/2022  ?Allergen Reaction Noted  ? Other  05/09/2020  ? Oxycodone  09/29/2019  ? Shellfish allergy Itching, Swelling, and Rash 05/19/2016  ? ? ?Family History  ?Problem Relation Age of Onset  ? Diabetes Mother   ? Hypertension Mother   ? Hypertension Sister   ? Hypertension Sister   ? Hypertension  Sister   ? Cancer Sister   ?     Ovarian Cancer   ? Colon cancer Brother   ? Cancer Brother   ?     colon at 55  ? Esophageal cancer Neg Hx   ? Stomach cancer Neg Hx   ? Rectal cancer Neg Hx   ? ? ?Social History  ? ?Socioeconomic History  ? Marital status: Married  ?  Spouse name: Not on file  ? Number of children: 0  ? Years of education: Not on file  ? Highest education level: Not on file  ?Occupational History  ? Occupation: Investment banker, operational  ?  Employer: [employer]  ?Tobacco Use  ? Smoking status: Never  ? Smokeless tobacco: Never  ?Vaping Use  ? Vaping Use: Never used  ?Substance and Sexual Activity  ? Alcohol use: Yes  ?  Comment: very rare  ? Drug use: No  ? Sexual activity: Not Currently  ?  Partners: Male  ?  Birth control/protection: None  ?  Comment: perimenopausal   ?Other Topics Concern  ? Not on file  ?Social History Narrative  ? ** Merged History Encounter **  ?    ? ?Social Determinants of Health  ? ?Financial Resource Strain: Not on file  ?Food Insecurity: Not on file  ?Transportation Needs: Not on file  ?Physical Activity: Not  on file  ?Stress: Not on file  ?Social Connections: Not on file  ?Intimate Partner Violence: Not on file  ? ? ? ?Physical Exam: ?LMP 07/14/2012  ?Constitutional: generally well-appearing ?Psychiatric: alert and oriented x3 ?Lungs: CTA bilaterally ?Heart: no MCR ? ?Assessment and plan: ?61 y.o. female with FH CRC (brother), chronic left sided abd pains ? ?Colonoscopy today ? ?Care is appropriate for the ambulatory setting. ? ?Owens Loffler, MD ?[facility] ?03/02/2022, 7:30 AM ? ? ? ?

## 2022-03-02 NOTE — Patient Instructions (Signed)
Read all of the handouts given to you by your recovery room nurse. ? ?YOU HAD AN ENDOSCOPIC PROCEDURE TODAY AT [facility]:   Refer to the procedure report that was given to you for any specific questions about what was found during the examination.  If the procedure report does not answer your questions, please call your gastroenterologist to clarify.  If you requested that your care partner not be given the details of your procedure findings, then the procedure report has been included in a sealed envelope for you to review at your convenience later. ? ?YOU SHOULD EXPECT: Some feelings of bloating in the abdomen. Passage of more gas than usual.  Walking can help get rid of the air that was put into your GI tract during the procedure and reduce the bloating. If you had a lower endoscopy (such as a colonoscopy or flexible sigmoidoscopy) you may notice spotting of blood in your stool or on the toilet paper. If you underwent a bowel prep for your procedure, you may not have a normal bowel movement for a few days. ? ?Please Note:  You might notice some irritation and congestion in your nose or some drainage.  This is from the oxygen used during your procedure.  There is no need for concern and it should clear up in a day or so. ? ?SYMPTOMS TO REPORT IMMEDIATELY: ? ?Following lower endoscopy (colonoscopy or flexible sigmoidoscopy): ? Excessive amounts of blood in the stool ? Significant tenderness or worsening of abdominal pains ? Swelling of the abdomen that is new, acute ? Fever of 100?F or higher ? ? ?For urgent or emergent issues, a gastroenterologist can be reached at any hour by calling [phone]. ?Do not use MyChart messaging for urgent concerns.  ? ? ?DIET:  We do recommend a small meal at first, but then you may proceed to your regular diet.  Drink plenty of fluids but you should avoid alcoholic beverages for 24 hours. ?Try to increase the fiber In your diet, and drink plenty of  water. ? ?ACTIVITY:  You should plan to take it easy for the rest of today and you should NOT DRIVE or use heavy machinery until tomorrow (because of the sedation medicines used during the test).   ? ?FOLLOW UP: ?Our staff will call the number listed on your records 48-72 hours following your procedure to check on you and address any questions or concerns that you may have regarding the information given to you following your procedure. If we do not reach you, we will leave a message.  We will attempt to reach you two times.  During this call, we will ask if you have developed any symptoms of COVID 19. If you develop any symptoms (ie: fever, flu-like symptoms, shortness of breath, cough etc.) before then, please call [phone].  If you test positive for Covid 19 in the 2 weeks post procedure, please call and report this information to [place].   ? ?If any biopsies were taken you will be contacted by phone or by letter within the next 1-3 weeks.  Please call us at [phone] if you have not heard about the biopsies in 3 weeks.  ? ? ?SIGNATURES/CONFIDENTIALITY: ?You and/or your care partner have signed paperwork which will be entered into your electronic medical record.  These signatures attest to the fact that that the information above on your After Visit Summary has been reviewed and is understood.  Full responsibility of the confidentiality of this  discharge information lies with you and/or your care-partner.  ?

## 2022-03-02 NOTE — Op Note (Signed)
[place] ?Patient Name: Megan Miles ?Procedure Date: 03/02/2022 7:49 AM ?MRN: [id number] ?Endoscopist: Milus Banister , MD ?Age: 61 ?Referring MD:  ?Date of Birth: [birth year] ?Gender: Female ?Account #: [id number] ?Procedure:                Colonoscopy ?Indications:              High risk colon cancer surveillance: brother died  ?                          in his 9s from colon cancer. ??Colonoscopy  ?                          03/2008??Dr. Ardis Hughs was normal.????????Colonoscopy  ?                          November 2018??found 2 subcentimeter adenomas,  ?                          hemorrhoids. ?Medicines:                Monitored Anesthesia Care ?Procedure:                Pre-Anesthesia Assessment: ?                          - Prior to the procedure, a History and Physical  ?                          was performed, and patient medications and  ?                          allergies were reviewed. The patient's tolerance of  ?                          previous anesthesia was also reviewed. The risks  ?                          and benefits of the procedure and the sedation  ?                          options and risks were discussed with the patient.  ?                          All questions were answered, and informed consent  ?                          was obtained. Prior Anticoagulants: The patient has  ?                          taken no previous anticoagulant or antiplatelet  ?                          agents. ASA Grade Assessment: II - A patient with  ?  mild systemic disease. After reviewing the risks  ?                          and benefits, the patient was deemed in  ?                          satisfactory condition to undergo the procedure. ?                          After obtaining informed consent, the colonoscope  ?                          was passed under direct vision. Throughout the  ?                          procedure, the patient's blood pressure, pulse, and  ?                           oxygen saturations were monitored continuously. The  ?                          Olympus CF-HQ190L (#[id number]) Colonoscope was  ?                          introduced through the anus and advanced to the the  ?                          cecum, identified by appendiceal orifice and  ?                          ileocecal valve. The colonoscopy was performed  ?                          without difficulty. The patient tolerated the  ?                          procedure well. The quality of the bowel  ?                          preparation was good. The ileocecal valve,  ?                          appendiceal orifice, and rectum were photographed. ?Scope In: 8:00:25 AM ?Scope Out: 8:09:34 AM ?Scope Withdrawal Time: 0 hours 6 minutes 35 seconds  ?Total Procedure Duration: 0 hours 9 minutes 9 seconds  ?Findings:                 A 3 mm polyp was found in the descending colon. The  ?                          polyp was sessile. The polyp was removed with a  ?                          cold snare. Resection and retrieval were  complete. ?                          Multiple small-mouthed diverticula were found in  ?                          the left colon. ?                          Internal hemorrhoids were found. The hemorrhoids  ?                          were small. ?                          The exam was otherwise without abnormality on  ?                          direct and retroflexion views. ?Complications:            No immediate complications. Estimated blood loss:  ?                          None. ?Estimated Blood Loss:     Estimated blood loss: none. ?Impression:               - One 3 mm polyp in the descending colon, removed  ?                          with a cold snare. Resected and retrieved. ?                          - Diverticulosis in the left colon. ?                          - Internal hemorrhoids. ?                          - The examination was otherwise normal on direct  ?                           and retroflexion views. ?Recommendation:           - Patient has a contact number available for  ?                          emergencies. The signs and symptoms of potential  ?                          delayed complications were discussed with the  ?                          patient. Return to normal activities tomorrow.  ?                          Written discharge instructions were provided to the  ?  patient. ?                          - Resume previous diet. ?                          - Continue present medications. ?                          - Await pathology results. ?Milus Banister, MD ?03/02/2022 8:12:57 AM ?This report has been signed electronically. ?

## 2022-03-04 ENCOUNTER — Telehealth: Payer: Self-pay

## 2022-03-04 ENCOUNTER — Telehealth: Payer: Self-pay | Admitting: *Deleted

## 2022-03-04 NOTE — Telephone Encounter (Signed)
Attempted to call patient for their post-procedure follow-up call. No answer. Left voicemail.   

## 2022-03-04 NOTE — Telephone Encounter (Signed)
Follow up call placed, VM obtained and message left. ?SChaplin, RN,BSN ? ?

## 2022-03-09 ENCOUNTER — Encounter: Payer: Self-pay | Admitting: Gastroenterology

## 2022-03-17 DIAGNOSIS — H1045 Other chronic allergic conjunctivitis: Secondary | ICD-10-CM | POA: Diagnosis not present

## 2022-03-17 DIAGNOSIS — H43823 Vitreomacular adhesion, bilateral: Secondary | ICD-10-CM | POA: Diagnosis not present

## 2022-03-17 DIAGNOSIS — H2513 Age-related nuclear cataract, bilateral: Secondary | ICD-10-CM | POA: Diagnosis not present

## 2022-03-17 DIAGNOSIS — H35363 Drusen (degenerative) of macula, bilateral: Secondary | ICD-10-CM | POA: Diagnosis not present

## 2022-03-24 ENCOUNTER — Encounter: Payer: Self-pay | Admitting: Gastroenterology

## 2022-03-24 ENCOUNTER — Telehealth: Payer: 59 | Admitting: Physician Assistant

## 2022-03-24 DIAGNOSIS — J302 Other seasonal allergic rhinitis: Secondary | ICD-10-CM

## 2022-03-24 DIAGNOSIS — K5730 Diverticulosis of large intestine without perforation or abscess without bleeding: Secondary | ICD-10-CM | POA: Insufficient documentation

## 2022-03-24 NOTE — Progress Notes (Signed)
E visit for Allergic Rhinitis ?We are sorry that you are not feeling well.  Here is how we plan to help! ? ?Based on what you have shared with me it looks like you have Allergic Rhinitis.  Rhinitis is when a reaction occurs that causes nasal congestion, runny nose, sneezing, and itching.  Most types of rhinitis are caused by an inflammation and are associated with symptoms in the eyes ears or throat. ?There are several types of rhinitis.  The most common are acute rhinitis, which is usually caused by a viral illness, allergic or seasonal rhinitis, and nonallergic or year-round rhinitis.  Nasal allergies occur certain times of the year.  Allergic rhinitis is caused when allergens in the air trigger the release of histamine in the body.  Histamine causes itching, swelling, and fluid to build up in the fragile linings of the nasal passages, sinuses and eyelids.  An itchy nose and clear discharge are common. ? ?I recommend the following over the counter treatments: ?You should take a daily dose of antihistamine. I would recommend Xyzal 5 mg OTC -- take 1 tablet daily -- in place of the Claritin as it is longer acting and very effective while being non-drowsy. ? ?I also would recommend a nasal spray: ?Flonase 2 sprays into each nostril once daily -- a prescription has been sent to the pharmacy.  ? ?You may also benefit from eye drops such as: ?Systane 1-2 driops each eye twice daily as needed ? ?HOME CARE: ? ?You can use an over-the-counter saline nasal spray as needed ?Avoid areas where there is heavy dust, mites, or molds ?Stay indoors on windy days during the pollen season ?Keep windows closed in home, at least in bedroom; use air conditioner. ?Use high-efficiency house air filter ?Keep windows closed in car, turn [facility] on re-circulate ?Avoid playing out with dog during pollen season ? ?GET HELP RIGHT AWAY IF: ? ?If your symptoms do not improve within 10 days ?You become short of breath ?You develop yellow or green  discharge from your nose for over 3 days ?You have coughing fits ? ?MAKE SURE YOU: ? ?Understand these instructions ?Will watch your condition ?Will get help right away if you are not doing well or get worse ? ?Thank you for choosing an e-visit. ?Your e-visit answers were reviewed by a board certified advanced clinical practitioner to complete your personal care plan. Depending upon the condition, your plan could have included both over the counter or prescription medications. ?Please review your pharmacy choice. Be sure that the pharmacy you have chosen is open so that you can pick up your prescription now.  If there is a problem you may message your provider in [place] to have the prescription routed to another pharmacy. ?Your safety is important to [place]. If you have drug allergies check your prescription carefully.  ?For the next 24 hours, you can use MyChart to ask questions about today?s visit, request a non-urgent call back, or ask for a work or school excuse from your e-visit provider. ?You will get an email in the next two days asking about your experience. I hope that your e-visit has been valuable and will speed your recovery. ? ? ? ? ? ? ? ?

## 2022-03-24 NOTE — Progress Notes (Signed)
I have spent 5 minutes in review of e-visit questionnaire, review and updating patient chart, medical decision making and response to patient.   Dali Kraner Cody Peytin Dechert, PA-C    

## 2022-03-25 ENCOUNTER — Other Ambulatory Visit (HOSPITAL_COMMUNITY): Payer: Self-pay

## 2022-03-25 ENCOUNTER — Telehealth: Payer: 59 | Admitting: Physician Assistant

## 2022-03-25 DIAGNOSIS — B9689 Other specified bacterial agents as the cause of diseases classified elsewhere: Secondary | ICD-10-CM

## 2022-03-25 MED ORDER — AMOXICILLIN-POT CLAVULANATE 875-125 MG PO TABS
1.0000 | ORAL_TABLET | Freq: Two times a day (BID) | ORAL | 0 refills | Status: DC
  Filled 2022-03-25: qty 14, 7d supply, fill #0

## 2022-03-25 NOTE — Progress Notes (Signed)

## 2022-05-13 ENCOUNTER — Other Ambulatory Visit: Payer: Self-pay | Admitting: Obstetrics and Gynecology

## 2022-05-13 DIAGNOSIS — Z1231 Encounter for screening mammogram for malignant neoplasm of breast: Secondary | ICD-10-CM

## 2022-05-15 ENCOUNTER — Other Ambulatory Visit (HOSPITAL_COMMUNITY): Payer: Self-pay

## 2022-05-15 DIAGNOSIS — E785 Hyperlipidemia, unspecified: Secondary | ICD-10-CM | POA: Diagnosis not present

## 2022-05-15 DIAGNOSIS — Z1331 Encounter for screening for depression: Secondary | ICD-10-CM | POA: Diagnosis not present

## 2022-05-15 DIAGNOSIS — Z0000 Encounter for general adult medical examination without abnormal findings: Secondary | ICD-10-CM | POA: Diagnosis not present

## 2022-05-15 DIAGNOSIS — B351 Tinea unguium: Secondary | ICD-10-CM | POA: Diagnosis not present

## 2022-05-15 MED ORDER — CICLOPIROX 8 % EX SOLN
1.0000 "application " | Freq: Every day | CUTANEOUS | 3 refills | Status: AC
  Filled 2022-05-15: qty 6.6, 30d supply, fill #0
  Filled 2022-07-22: qty 6.6, 30d supply, fill #1
  Filled 2023-03-22: qty 6.6, 30d supply, fill #2

## 2022-05-27 ENCOUNTER — Ambulatory Visit: Payer: 59 | Admitting: Podiatry

## 2022-06-12 DIAGNOSIS — I83891 Varicose veins of right lower extremities with other complications: Secondary | ICD-10-CM | POA: Diagnosis not present

## 2022-06-15 ENCOUNTER — Ambulatory Visit
Admission: RE | Admit: 2022-06-15 | Discharge: 2022-06-15 | Disposition: A | Discharge status: Home / Self Care | Payer: 59 | Source: Ambulatory Visit | Attending: Obstetrics and Gynecology | Admitting: Obstetrics and Gynecology

## 2022-06-15 DIAGNOSIS — Z1231 Encounter for screening mammogram for malignant neoplasm of breast: Secondary | ICD-10-CM | POA: Diagnosis not present

## 2022-06-19 DIAGNOSIS — I83891 Varicose veins of right lower extremities with other complications: Secondary | ICD-10-CM | POA: Diagnosis not present

## 2022-06-19 DIAGNOSIS — Z09 Encounter for follow-up examination after completed treatment for conditions other than malignant neoplasm: Secondary | ICD-10-CM | POA: Diagnosis not present

## 2022-07-05 ENCOUNTER — Telehealth: Payer: 59 | Admitting: Nurse Practitioner

## 2022-07-05 DIAGNOSIS — J329 Chronic sinusitis, unspecified: Secondary | ICD-10-CM

## 2022-07-05 DIAGNOSIS — B9689 Other specified bacterial agents as the cause of diseases classified elsewhere: Secondary | ICD-10-CM | POA: Diagnosis not present

## 2022-07-05 MED ORDER — AMOXICILLIN-POT CLAVULANATE 875-125 MG PO TABS: 1.0000 | ORAL_TABLET | Freq: Two times a day (BID) | ORAL | 0 refills | Status: AC

## 2022-07-05 NOTE — Progress Notes (Signed)
I have spent 5 minutes in review of e-visit questionnaire, review and updating patient chart, medical decision making and response to patient.  ° °Treylen Gibbs W Kyleigha Markert, NP ° °  °

## 2022-07-05 NOTE — Progress Notes (Signed)

## 2022-07-22 ENCOUNTER — Other Ambulatory Visit (HOSPITAL_COMMUNITY): Payer: Self-pay

## 2022-07-28 ENCOUNTER — Other Ambulatory Visit (HOSPITAL_COMMUNITY): Payer: Self-pay

## 2022-07-28 MED ORDER — TRIAMCINOLONE ACETONIDE 40 MG/ML IJ SUSP
INTRAMUSCULAR | 0 refills | Status: DC
  Filled 2022-07-28: qty 1, 1d supply, fill #0

## 2022-07-29 ENCOUNTER — Other Ambulatory Visit (HOSPITAL_COMMUNITY): Payer: Self-pay

## 2022-08-05 ENCOUNTER — Other Ambulatory Visit (HOSPITAL_COMMUNITY): Payer: Self-pay

## 2022-08-24 DIAGNOSIS — L910 Hypertrophic scar: Secondary | ICD-10-CM | POA: Diagnosis not present

## 2022-09-29 ENCOUNTER — Encounter (HOSPITAL_COMMUNITY): Payer: Self-pay | Admitting: Pharmacist

## 2022-09-29 ENCOUNTER — Other Ambulatory Visit (HOSPITAL_COMMUNITY): Payer: Self-pay

## 2022-09-29 MED ORDER — TRIAMCINOLONE ACETONIDE 40 MG/ML IJ SUSP
INTRAMUSCULAR | 0 refills | Status: AC
  Filled 2022-09-29: qty 1, 1d supply, fill #0

## 2022-09-30 ENCOUNTER — Other Ambulatory Visit (HOSPITAL_COMMUNITY): Payer: Self-pay

## 2022-10-06 DIAGNOSIS — Z6827 Body mass index (BMI) 27.0-27.9, adult: Secondary | ICD-10-CM | POA: Diagnosis not present

## 2022-10-06 DIAGNOSIS — Z01419 Encounter for gynecological examination (general) (routine) without abnormal findings: Secondary | ICD-10-CM | POA: Diagnosis not present

## 2022-10-06 DIAGNOSIS — Z1231 Encounter for screening mammogram for malignant neoplasm of breast: Secondary | ICD-10-CM | POA: Diagnosis not present

## 2022-10-06 DIAGNOSIS — L910 Hypertrophic scar: Secondary | ICD-10-CM | POA: Diagnosis not present

## 2022-10-06 DIAGNOSIS — Z1211 Encounter for screening for malignant neoplasm of colon: Secondary | ICD-10-CM | POA: Diagnosis not present

## 2022-12-21 ENCOUNTER — Ambulatory Visit (INDEPENDENT_AMBULATORY_CARE_PROVIDER_SITE_OTHER): Payer: 59 | Admitting: Podiatry

## 2022-12-21 ENCOUNTER — Other Ambulatory Visit (HOSPITAL_COMMUNITY): Payer: Self-pay

## 2022-12-21 ENCOUNTER — Encounter: Payer: Self-pay | Admitting: Podiatry

## 2022-12-21 VITALS — BP 126/84

## 2022-12-21 DIAGNOSIS — B351 Tinea unguium: Secondary | ICD-10-CM | POA: Diagnosis not present

## 2022-12-21 DIAGNOSIS — L819 Disorder of pigmentation, unspecified: Secondary | ICD-10-CM | POA: Diagnosis not present

## 2022-12-21 MED ORDER — TERBINAFINE HCL 250 MG PO TABS
250.0000 mg | ORAL_TABLET | Freq: Every day | ORAL | 0 refills | Status: AC
  Filled 2022-12-21: qty 90, 90d supply, fill #0

## 2022-12-21 NOTE — Addendum Note (Signed)
Addended by: Melina Modena D on: 12/21/2022 09:45 AM   Modules accepted: Orders

## 2022-12-21 NOTE — Progress Notes (Signed)
  Subjective:  Patient ID: Megan Miles, female    DOB: 1961/10/16,   MRN: [id number]  Chief Complaint  Patient presents with   Nail Problem    Possible fungus on both feet     62 y.o. female presents for concern of nail fungus on both feet. Relates she has been dealing with it for a while. She has tried a topical medication in the past that did not help. Patient would like to try something more aggressive.  . Denies any other pedal complaints. Denies n/v/f/c.   Past Medical History:  Diagnosis Date   Anemia    low iron  border line as a child   Anxiety    Phreesia 05/09/2020   Depression    Phreesia 05/09/2020   GERD (gastroesophageal reflux disease)    hx of no meds now   Hyperlipidemia 06/21/2017   Ovarian cyst    Vitamin D deficiency 06/21/2017    Objective:  Physical Exam: Vascular: DP/PT pulses 2/4 bilateral. CFT <3 seconds. Normal hair growth on digits. No edema.  Skin. No lacerations or abrasions bilateral feet. Bilateral hallux nails with some discoloration. Bilateral fourth and fifth nails with thickness discoloration and subungual debris.  Musculoskeletal: MMT 5/5 bilateral lower extremities in DF, PF, Inversion and Eversion. Deceased ROM in DF of ankle joint.  Neurological: Sensation intact to light touch.   Assessment:   1. Onychomycosis      Plan:  Patient was evaluated and treated and all questions answered. -Examined patient -Discussed treatment options for painful dystrophic nails  -Clinical picture and Fungal culture was obtained by removing a portion of the hard nail itself from each of the involved toenails using a sterile nail nipper and sent to [facility] lab. Patient tolerated the biopsy procedure well without discomfort or need for anesthesia.  -Discussed fungal nail treatment options including oral, topical, and laser treatments.  -Previous LFTs have been in normal range. Lamisil prescribed for 90 days.  -Patient to return in 4 weeks for follow up  evaluation and discussion of fungal culture results or sooner if symptoms worsen.   Lorenda Peck, DPM

## 2022-12-21 NOTE — Addendum Note (Signed)
Addended by: Melina Modena D on: 12/21/2022 11:45 AM   Modules accepted: Orders

## 2023-02-17 ENCOUNTER — Other Ambulatory Visit (HOSPITAL_COMMUNITY): Payer: Self-pay

## 2023-02-17 MED ORDER — AMOXICILLIN 875 MG PO TABS
875.0000 mg | ORAL_TABLET | Freq: Two times a day (BID) | ORAL | 0 refills | Status: AC
  Filled 2023-02-17: qty 10, 5d supply, fill #0

## 2023-02-17 MED ORDER — NAPROXEN 500 MG PO TABS
500.0000 mg | ORAL_TABLET | Freq: Two times a day (BID) | ORAL | 0 refills | Status: AC
  Filled 2023-02-17: qty 10, 5d supply, fill #0

## 2023-03-22 ENCOUNTER — Other Ambulatory Visit (HOSPITAL_COMMUNITY): Payer: Self-pay

## 2023-03-22 ENCOUNTER — Encounter: Payer: Self-pay | Admitting: Podiatry

## 2023-03-22 ENCOUNTER — Ambulatory Visit (INDEPENDENT_AMBULATORY_CARE_PROVIDER_SITE_OTHER): Payer: 59 | Admitting: Podiatry

## 2023-03-22 DIAGNOSIS — B351 Tinea unguium: Secondary | ICD-10-CM | POA: Diagnosis not present

## 2023-03-22 LAB — HEPATIC FUNCTION PANEL
AG Ratio: 1.6 (calc) (ref 1.0–2.5)
ALT: 16 U/L (ref 6–29)
AST: 16 U/L (ref 10–35)
Albumin: 4.4 g/dL (ref 3.6–5.1)
Alkaline phosphatase (APISO): 106 U/L (ref 37–153)
Bilirubin, Direct: 0.1 mg/dL (ref 0.0–0.2)
Globulin: 2.7 g/dL (calc) (ref 1.9–3.7)
Indirect Bilirubin: 0.2 mg/dL (calc) (ref 0.2–1.2)
Total Bilirubin: 0.3 mg/dL (ref 0.2–1.2)
Total Protein: 7.1 g/dL (ref 6.1–8.1)

## 2023-03-22 MED ORDER — EFINACONAZOLE 10 % EX SOLN
1.0000 [drp] | Freq: Every day | CUTANEOUS | 11 refills | Status: AC
  Filled 2023-03-22: qty 4, 25d supply, fill #0

## 2023-03-22 MED ORDER — EFINACONAZOLE 10 % EX SOLN: 1.0000 [drp] | Freq: Every day | CUTANEOUS | 11 refills | Status: AC

## 2023-03-22 NOTE — Progress Notes (Signed)
  Subjective:  Patient ID: Megan Miles, female    DOB: 1961-11-06,   MRN: [id number]  Chief Complaint  Patient presents with   Nail Problem    Nail fungus f/u patient states she feels as though her nails have improved some but there is still some discoloration and thickness    62 y.o. female presents for follow-up of nail fungus. Has been on the lamisil for three months now. States the nails are getting better but still have some discoloration.  Denies any other pedal complaints. Denies n/v/f/c.   Past Medical History:  Diagnosis Date   Anemia    low iron  border line as a child   Anxiety    Phreesia 05/09/2020   Depression    Phreesia 05/09/2020   GERD (gastroesophageal reflux disease)    hx of no meds now   Hyperlipidemia 06/21/2017   Ovarian cyst    Vitamin D deficiency 06/21/2017    Objective:  Physical Exam: Vascular: DP/PT pulses 2/4 bilateral. CFT <3 seconds. Normal hair growth on digits. No edema.  Skin. No lacerations or abrasions bilateral feet. Bilateral hallux nails with some discoloration. Bilateral fourth and fifth nails with thickness discoloration and subungual debris.  Musculoskeletal: MMT 5/5 bilateral lower extremities in DF, PF, Inversion and Eversion. Deceased ROM in DF of ankle joint.  Neurological: Sensation intact to light touch.   Assessment:   1. Onychomycosis       Plan:  Patient was evaluated and treated and all questions answered. -Examined patient -Discussed treatment options for painful dystrophic nails  Culture results reviewed and show fungus as well as areas of microtrauma present  -Will hold off on Lamisil for now due to side effects.  -Ciclopirox has been ineffective in past and would like to try jublia to get rid of the rest of the fungus-LFTS ordered.  -Return in 3 montsh for recheck.   Louann Sjogren, DPM

## 2023-03-29 ENCOUNTER — Other Ambulatory Visit (HOSPITAL_COMMUNITY): Payer: Self-pay

## 2023-03-29 MED ORDER — TRIAMCINOLONE ACETONIDE 0.5 % EX CREA
1.0000 | TOPICAL_CREAM | Freq: Two times a day (BID) | CUTANEOUS | 3 refills | Status: AC
  Filled 2023-03-29: qty 30, 15d supply, fill #0

## 2023-06-16 ENCOUNTER — Other Ambulatory Visit (HOSPITAL_COMMUNITY): Payer: Self-pay

## 2023-06-16 MED ORDER — NAPROXEN 500 MG PO TABS
500.0000 mg | ORAL_TABLET | Freq: Two times a day (BID) | ORAL | 0 refills | Status: AC
  Filled 2023-06-16: qty 16, 8d supply, fill #0

## 2023-06-16 MED ORDER — AMOXICILLIN 875 MG PO TABS
875.0000 mg | ORAL_TABLET | Freq: Two times a day (BID) | ORAL | 0 refills | Status: AC
  Filled 2023-06-16: qty 14, 7d supply, fill #0

## 2023-06-21 ENCOUNTER — Ambulatory Visit (INDEPENDENT_AMBULATORY_CARE_PROVIDER_SITE_OTHER): Payer: Managed Care, Other (non HMO) | Admitting: Podiatry

## 2023-06-21 DIAGNOSIS — B351 Tinea unguium: Secondary | ICD-10-CM

## 2023-06-21 NOTE — Progress Notes (Signed)
  Subjective:  Patient ID: Megan Miles, female    DOB: 12-31-1960,   MRN: [id number]  Chief Complaint  Patient presents with   Nail Problem    3 months nail fungus f/u patient states she has seen improvement , states her left great to still has fungus on it     62 y.o. female presents for follow-up of nail fungus. Relates doing well and fourht and fifth digit nails doing very well. Has been using the jublia.   Denies any other pedal complaints. Denies n/v/f/c.   Past Medical History:  Diagnosis Date   Anemia    low iron  border line as a child   Anxiety    Phreesia 05/09/2020   Depression    Phreesia 05/09/2020   GERD (gastroesophageal reflux disease)    hx of no meds now   Hyperlipidemia 06/21/2017   Ovarian cyst    Vitamin D deficiency 06/21/2017    Objective:  Physical Exam: Vascular: DP/PT pulses 2/4 bilateral. CFT <3 seconds. Normal hair growth on digits. No edema.  Skin. No lacerations or abrasions bilateral feet. Bilateral hallux nails with some discoloration. Bilateral fourth and fifth nailsnormal in appearance.  Musculoskeletal: MMT 5/5 bilateral lower extremities in DF, PF, Inversion and Eversion. Deceased ROM in DF of ankle joint.  Neurological: Sensation intact to light touch.   Assessment:   1. Onychomycosis        Plan:  Patient was evaluated and treated and all questions answered. -Examined patient -Discussed treatment options for painful dystrophic nails  Culture results reviewed and show fungus as well as areas of microtrauma present  -Continue one more month with jublia.  -Discussed nail polish for fungal nails.  -Return as needed  Louann Sjogren, DPM

## 2023-09-02 ENCOUNTER — Other Ambulatory Visit (HOSPITAL_COMMUNITY): Payer: Self-pay

## 2023-09-02 MED ORDER — VALACYCLOVIR HCL 500 MG PO TABS
500.0000 mg | ORAL_TABLET | Freq: Three times a day (TID) | ORAL | 1 refills | Status: AC
  Filled 2023-09-02: qty 15, 5d supply, fill #0

## 2023-09-13 ENCOUNTER — Telehealth: Payer: Commercial Managed Care - PPO | Admitting: Emergency Medicine

## 2023-09-13 DIAGNOSIS — R300 Dysuria: Secondary | ICD-10-CM

## 2023-09-13 NOTE — Progress Notes (Signed)
Patient outside of treatment area.  Redirected.

## 2023-10-14 ENCOUNTER — Other Ambulatory Visit (HOSPITAL_BASED_OUTPATIENT_CLINIC_OR_DEPARTMENT_OTHER): Payer: Self-pay

## 2023-10-14 MED ORDER — FLULAVAL 0.5 ML IM SUSY
0.5000 mL | PREFILLED_SYRINGE | Freq: Once | INTRAMUSCULAR | 0 refills | Status: AC
  Filled 2023-10-14: qty 0.5, 1d supply, fill #0

## 2023-10-14 MED ORDER — COVID-19 MRNA VAC-TRIS(PFIZER) 30 MCG/0.3ML IM SUSY
0.3000 mL | PREFILLED_SYRINGE | Freq: Once | INTRAMUSCULAR | 0 refills | Status: AC
  Filled 2023-10-14: qty 0.3, 1d supply, fill #0

## 2023-10-21 ENCOUNTER — Other Ambulatory Visit: Payer: Self-pay | Admitting: Obstetrics and Gynecology

## 2023-10-21 DIAGNOSIS — N63 Unspecified lump in unspecified breast: Secondary | ICD-10-CM

## 2023-11-01 ENCOUNTER — Ambulatory Visit
Admission: RE | Admit: 2023-11-01 | Discharge: 2023-11-01 | Disposition: A | Discharge status: Home / Self Care | Payer: Managed Care, Other (non HMO) | Source: Ambulatory Visit | Attending: Obstetrics and Gynecology | Admitting: Obstetrics and Gynecology

## 2023-11-01 ENCOUNTER — Ambulatory Visit
Admission: RE | Admit: 2023-11-01 | Discharge: 2023-11-01 | Disposition: A | Discharge status: Home / Self Care | Payer: Commercial Managed Care - PPO | Source: Ambulatory Visit | Attending: Obstetrics and Gynecology | Admitting: Obstetrics and Gynecology

## 2023-11-01 DIAGNOSIS — N63 Unspecified lump in unspecified breast: Secondary | ICD-10-CM

## 2024-01-11 IMAGING — DX DG LUMBAR SPINE 2-3V
2 series · 2 of 2 positions shown · non-contrast
Comparison: None.

CLINICAL DATA: Back pain

EXAM:
LUMBAR SPINE - 2-3 VIEW

[view not recorded (1 of 2)]
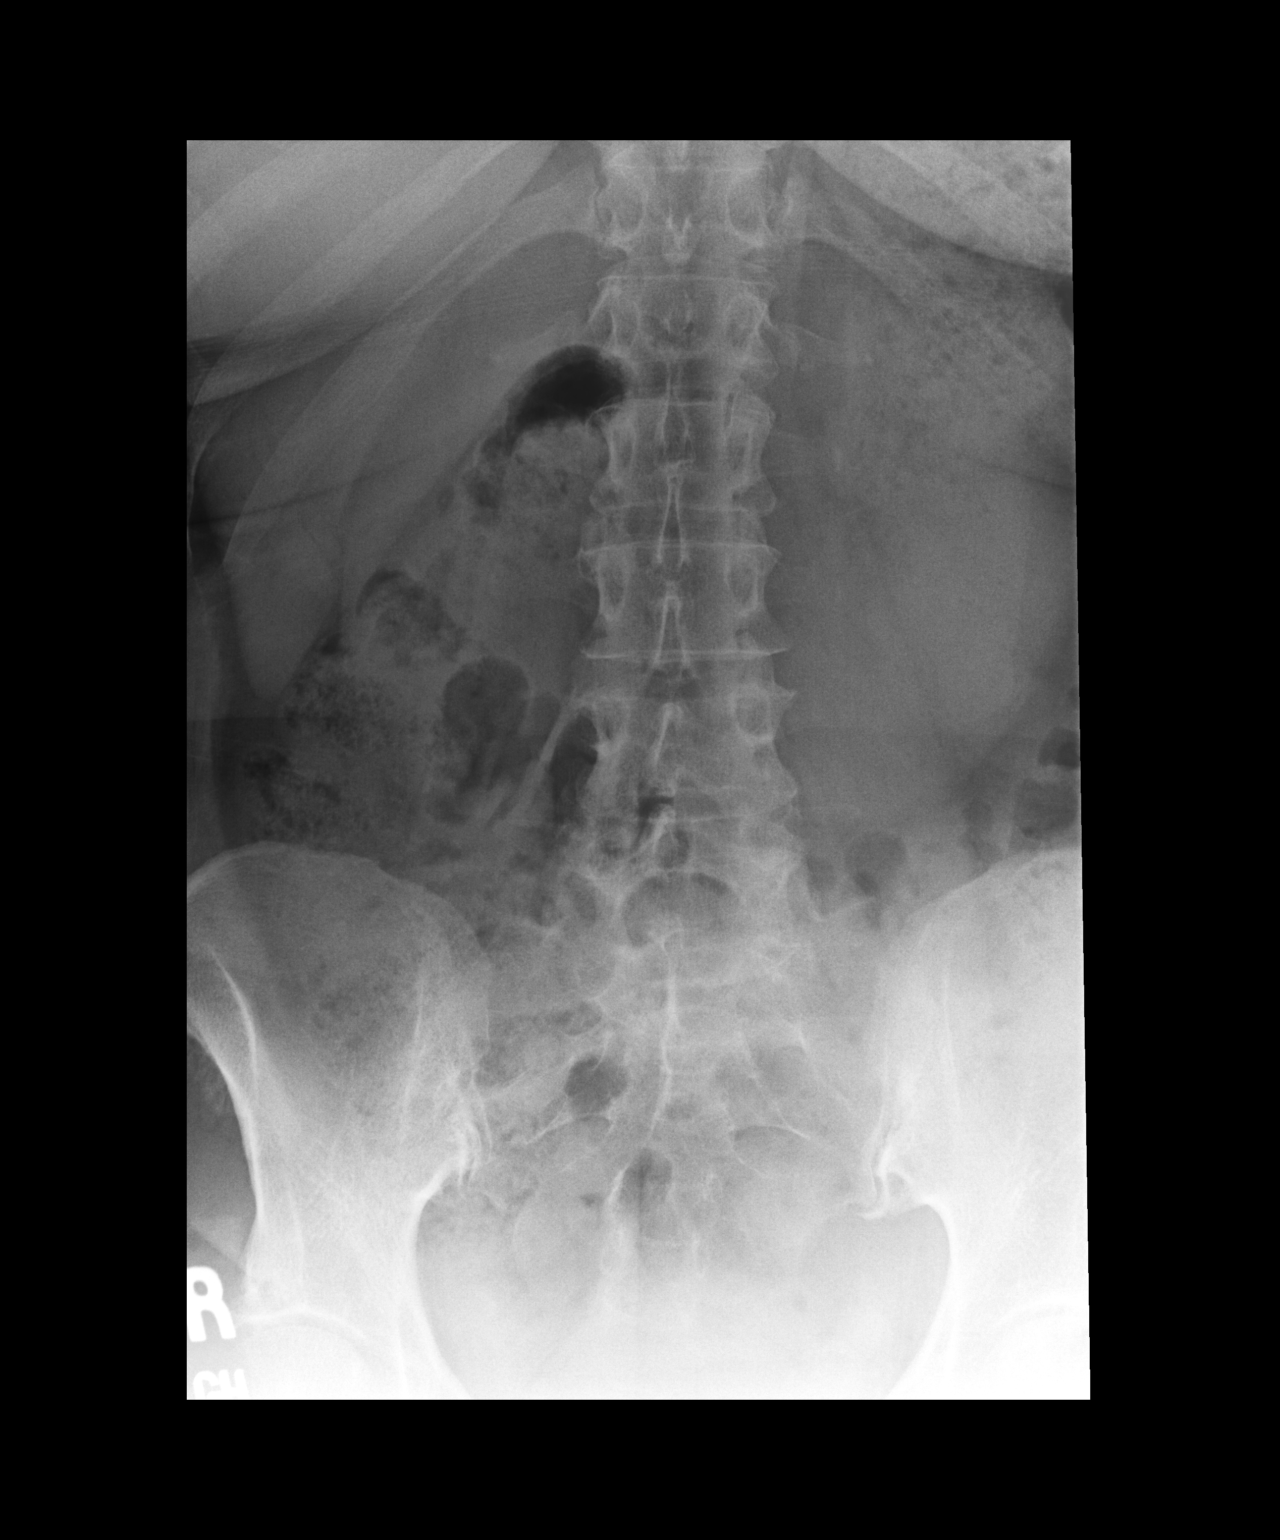

[view not recorded (2 of 2)]
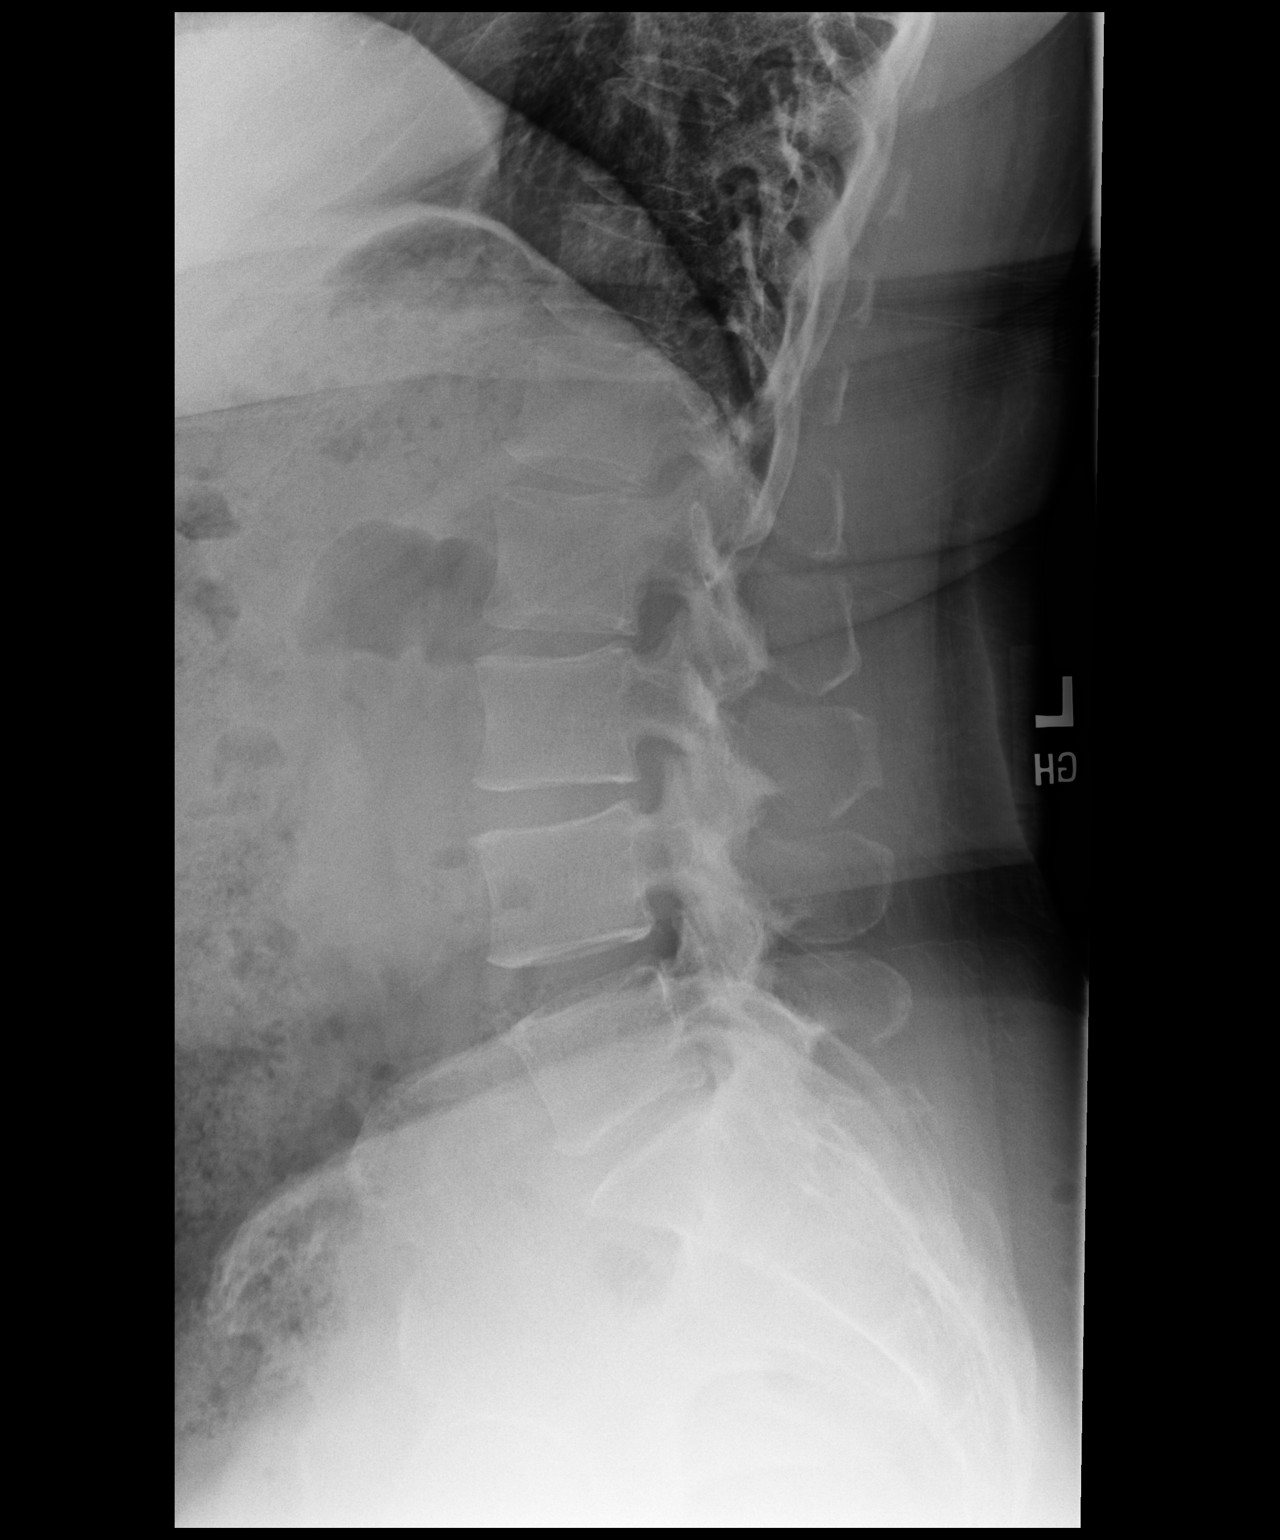

[2 of 2 positions shown; findings below may reference images not displayed]

FINDINGS: No recent fracture is seen. Alignment of posterior margins of
vertebral bodies is unremarkable. Degenerative changes are noted
with disc space narrowing and facet hypertrophy at L5-S1 level.
IMPRESSION: No recent fracture is seen in the lumbar spine. Lumbar spondylosis
at L5-S1 level with disc space narrowing and facet hypertrophy.

## 2024-02-02 ENCOUNTER — Other Ambulatory Visit: Payer: Self-pay | Admitting: Medical Genetics

## 2024-03-10 ENCOUNTER — Other Ambulatory Visit (HOSPITAL_COMMUNITY): Payer: Self-pay

## 2024-03-10 MED ORDER — NAPROXEN 500 MG PO TABS
500.0000 mg | ORAL_TABLET | Freq: Two times a day (BID) | ORAL | 0 refills | Status: AC
  Filled 2024-03-10: qty 10, 5d supply, fill #0

## 2024-03-17 ENCOUNTER — Other Ambulatory Visit: Payer: Self-pay

## 2024-03-17 DIAGNOSIS — Z006 Encounter for examination for normal comparison and control in clinical research program: Secondary | ICD-10-CM

## 2024-04-09 ENCOUNTER — Encounter (INDEPENDENT_AMBULATORY_CARE_PROVIDER_SITE_OTHER): Payer: Self-pay

## 2024-04-10 LAB — GENECONNECT MOLECULAR SCREEN: Genetic Analysis Overall Interpretation: NEGATIVE

## 2024-08-17 ENCOUNTER — Other Ambulatory Visit: Payer: Self-pay | Admitting: Obstetrics and Gynecology

## 2024-08-17 DIAGNOSIS — Z1231 Encounter for screening mammogram for malignant neoplasm of breast: Secondary | ICD-10-CM

## 2024-10-11 ENCOUNTER — Other Ambulatory Visit (HOSPITAL_COMMUNITY): Payer: Self-pay

## 2024-10-11 MED ORDER — TERBINAFINE HCL 1 % EX CREA
1.0000 | TOPICAL_CREAM | Freq: Two times a day (BID) | CUTANEOUS | 0 refills | Status: AC
  Filled 2024-10-11: qty 15, 8d supply, fill #0

## 2024-11-08 ENCOUNTER — Other Ambulatory Visit (HOSPITAL_COMMUNITY): Payer: Self-pay
